# Patient Record
Sex: Female | Born: 1960
Health system: Southern US, Community
[De-identification: ages and names within clinical notes are randomized; demographics above are authoritative.]

## PROBLEM LIST (undated history)

## (undated) DIAGNOSIS — M7120 Synovial cyst of popliteal space [Baker], unspecified knee: Secondary | ICD-10-CM

## (undated) DIAGNOSIS — R000 Tachycardia, unspecified: Secondary | ICD-10-CM

## (undated) DIAGNOSIS — R0602 Shortness of breath: Secondary | ICD-10-CM

## (undated) DIAGNOSIS — E785 Hyperlipidemia, unspecified: Secondary | ICD-10-CM

## (undated) DIAGNOSIS — I471 Supraventricular tachycardia, unspecified: Secondary | ICD-10-CM

## (undated) HISTORY — DX: Hyperlipidemia, unspecified: E78.5

## (undated) HISTORY — PX: COLONOSCOPY: SHX174

## (undated) HISTORY — DX: Supraventricular tachycardia, unspecified: I47.10

## (undated) HISTORY — DX: Synovial cyst of popliteal space (Baker), unspecified knee: M71.20

## (undated) HISTORY — PX: WISDOM TOOTH EXTRACTION: SHX21

## (undated) HISTORY — DX: Shortness of breath: R06.02

## (undated) HISTORY — PX: OTHER SURGICAL HISTORY: SHX169

## (undated) HISTORY — DX: Tachycardia, unspecified: R00.0

## (undated) HISTORY — DX: Supraventricular tachycardia: I47.1

---

## 1998-10-23 ENCOUNTER — Other Ambulatory Visit: Admission: RE | Admit: 1998-10-23 | Discharge: 1998-10-23 | Payer: Self-pay | Admitting: Obstetrics and Gynecology

## 1999-11-17 ENCOUNTER — Other Ambulatory Visit: Admission: RE | Admit: 1999-11-17 | Discharge: 1999-11-17 | Payer: Self-pay | Admitting: Obstetrics and Gynecology

## 2001-02-10 ENCOUNTER — Other Ambulatory Visit: Admission: RE | Admit: 2001-02-10 | Discharge: 2001-02-10 | Payer: Self-pay | Admitting: Obstetrics and Gynecology

## 2012-04-11 ENCOUNTER — Other Ambulatory Visit: Payer: Self-pay | Admitting: Orthopedic Surgery

## 2012-04-11 DIAGNOSIS — M7120 Synovial cyst of popliteal space [Baker], unspecified knee: Secondary | ICD-10-CM

## 2012-04-12 ENCOUNTER — Ambulatory Visit
Admission: RE | Admit: 2012-04-12 | Discharge: 2012-04-12 | Disposition: A | Discharge status: Home / Self Care | Payer: BC Managed Care – PPO | Source: Ambulatory Visit | Attending: Orthopedic Surgery | Admitting: Orthopedic Surgery

## 2012-04-12 DIAGNOSIS — M7120 Synovial cyst of popliteal space [Baker], unspecified knee: Secondary | ICD-10-CM

## 2012-04-12 HISTORY — DX: Synovial cyst of popliteal space (Baker), unspecified knee: M71.20

## 2012-04-18 ENCOUNTER — Other Ambulatory Visit: Payer: Self-pay

## 2012-04-25 ENCOUNTER — Other Ambulatory Visit: Payer: Self-pay

## 2013-06-21 ENCOUNTER — Other Ambulatory Visit: Payer: Self-pay | Admitting: Physician Assistant

## 2013-06-21 ENCOUNTER — Ambulatory Visit
Admission: RE | Admit: 2013-06-21 | Discharge: 2013-06-21 | Disposition: A | Discharge status: Home / Self Care | Payer: BC Managed Care – PPO | Source: Ambulatory Visit | Attending: Physician Assistant | Admitting: Physician Assistant

## 2013-06-21 DIAGNOSIS — R0602 Shortness of breath: Secondary | ICD-10-CM

## 2013-06-26 DIAGNOSIS — R000 Tachycardia, unspecified: Secondary | ICD-10-CM

## 2013-06-26 DIAGNOSIS — R0602 Shortness of breath: Secondary | ICD-10-CM

## 2013-06-26 HISTORY — DX: Tachycardia, unspecified: R00.0

## 2013-06-26 HISTORY — DX: Shortness of breath: R06.02

## 2013-07-06 ENCOUNTER — Encounter: Payer: Self-pay | Admitting: Cardiology

## 2013-07-06 ENCOUNTER — Ambulatory Visit (INDEPENDENT_AMBULATORY_CARE_PROVIDER_SITE_OTHER): Payer: BC Managed Care – PPO | Admitting: Cardiology

## 2013-07-06 VITALS — BP 137/77 | HR 73 | Ht 68.0 in | Wt 158.0 lb

## 2013-07-06 DIAGNOSIS — I471 Supraventricular tachycardia: Secondary | ICD-10-CM

## 2013-07-06 DIAGNOSIS — R079 Chest pain, unspecified: Secondary | ICD-10-CM

## 2013-07-06 NOTE — Progress Notes (Signed)
Patient ID: Amber Patel, female   DOB: 21-Apr-1961, 52 y.o.   MRN: [id number]    Patient Name: Amber Patel Date of Encounter: 07/06/2013  Primary Care Provider:  No primary provider on file. Primary Cardiologist:  Tobias Alexander, H  Patient Profile  Tachycardia  Problem List   No past medical history on file. No past surgical history on file.  Allergies  No Known Allergies  HPI  A very pleasant  52 year old younger appearing female who is coming for concern of tachycardia. The patient has no prior medical history and she has been very active her whole life. In the last year she was trying to be even more active and started to attend multiple exercise classes including spinning and yoga. The patient states that when she goes to her spinning class she is trying to push herself and her heart rate that she is following on her heart rate monitor goes up to 200 beats per minute. She becomes profoundly short of breath and sometimes it is even associated with chest tightness. This doesn't happen after very high level of exercise. She doesn't have any other episodes of chest pain no orthopnea no paroxysmal nocturnal dyspnea no lower extremity edema no prior episode of syncope.  Home Medications  Prior to Admission medications   Medication Sig Start Date End Date Taking? Authorizing Provider  ALPRAZolam Prudy Feeler) 0.25 MG tablet Take 0.25 mg by mouth at bedtime as needed for sleep.   Yes Historical Provider, MD  aspirin 81 MG tablet Take 81 mg by mouth as needed for pain.   Yes Historical Provider, MD    Family History  No family history on file.  Social History  History   Social History  . Marital Status: Unknown    Spouse Name: N/A    Number of Children: N/A  . Years of Education: N/A   Occupational History  . Not on file.   Social History Main Topics  . Smoking status: Never Smoker   . Smokeless tobacco: Not on file  . Alcohol Use: Yes  . Drug Use: No  . Sexual  Activity: Not on file   Other Topics Concern  . Not on file   Social History Narrative  . No narrative on file     Review of Systems as per history of present illness otherwise negative General:  No chills, fever, night sweats or weight changes.  Cardiovascular:  No chest pain, dyspnea on exertion, edema, orthopnea, palpitations, paroxysmal nocturnal dyspnea. Dermatological: No rash, lesions/masses Respiratory: No cough, dyspnea Urologic: No hematuria, dysuria Abdominal:   No nausea, vomiting, diarrhea, bright red blood per rectum, melena, or hematemesis Neurologic:  No visual changes, wkns, changes in mental status. All other systems reviewed and are otherwise negative except as noted above.  Physical Exam  Blood pressure 137/77, pulse 73, height 5\' 8"  (1.727 m), weight 158 lb (71.668 kg), last menstrual period 06/12/2013.  General: Pleasant, NAD Psych: Normal affect. Neuro: Alert and oriented X 3. Moves all extremities spontaneously. HEENT: Normal  Neck: Supple without bruits or JVD. Lungs:  Resp regular and unlabored, CTA. Heart: RRR no s3, s4, or murmurs. Abdomen: Soft, non-tender, non-distended, BS + x 4.  Extremities: No clubbing, cyanosis or edema. DP/PT/Radials 2+ and equal bilaterally.  Accessory Clinical Findings  ECG - sinus rhythm 73 beats per minutes, normal EKG    Assessment & Plan  52 year old young-appearing female with concerns of exercise induced tachycardia. He was scheduled the patient for an exercise stress echocardiogram, assess  her exercise capacity as well as heart rate and blood pressure response to exercise and rule out ischemia. At rest the patient's blood pressure is always controlled. She has had certain her cholesterol panel checked primary care physician and he feels all within normal limit. We will also order a 48-hour Holter monitor to do want this patient to wear during her exercise to see for how long her heart rate remains increased after  her exercise and also to rule out if there any arrhythmias.  Followup in 6 weeks    Tobias Alexander, Rexene Edison, MD 07/06/2013, 11:54 AM

## 2013-07-06 NOTE — Patient Instructions (Signed)
Your physician has recommended that you wear an event monitor for 2 weeks. Event monitors are medical devices that record the heart's electrical activity. Doctors most often [place] these monitors to diagnose arrhythmias. Arrhythmias are problems with the speed or rhythm of the heartbeat. The monitor is a small, portable device. You can wear one while you do your normal daily activities. This is usually used to diagnose what is causing palpitations/syncope (passing out).  Your physician has requested that you have a stress echocardiogram. For further information please visit [email or web address] Please follow instruction sheet as given.  Your physician has requested that you have an echocardiogram. Echocardiography is a painless test that uses sound waves to create images of your heart. It provides your doctor with information about the size and shape of your heart and how well your heart's chambers and valves are working. This procedure takes approximately one hour. There are no restrictions for this procedure.  Your physician recommends that you schedule a follow-up appointment in: after test.

## 2013-07-12 ENCOUNTER — Other Ambulatory Visit: Payer: Self-pay | Admitting: *Deleted

## 2013-07-12 ENCOUNTER — Encounter: Payer: Self-pay | Admitting: *Deleted

## 2013-07-12 ENCOUNTER — Encounter (INDEPENDENT_AMBULATORY_CARE_PROVIDER_SITE_OTHER): Payer: BC Managed Care – PPO

## 2013-07-12 DIAGNOSIS — I471 Supraventricular tachycardia: Secondary | ICD-10-CM

## 2013-07-12 DIAGNOSIS — R079 Chest pain, unspecified: Secondary | ICD-10-CM

## 2013-07-12 NOTE — Progress Notes (Signed)
Patient ID: Amber Patel, female   DOB: 01/26/1961, 52 y.o.   MRN: [id number] E-Cardio Verite 14 day cardiac event monitor applied to patient.

## 2013-07-27 ENCOUNTER — Other Ambulatory Visit (HOSPITAL_COMMUNITY): Payer: BC Managed Care – PPO

## 2013-07-31 ENCOUNTER — Ambulatory Visit: Payer: BC Managed Care – PPO | Admitting: Cardiology

## 2013-08-02 ENCOUNTER — Ambulatory Visit (HOSPITAL_COMMUNITY): Payer: BC Managed Care – PPO | Attending: Cardiovascular Disease

## 2013-08-02 ENCOUNTER — Encounter: Payer: Self-pay | Admitting: Cardiovascular Disease

## 2013-08-02 DIAGNOSIS — R0609 Other forms of dyspnea: Secondary | ICD-10-CM | POA: Insufficient documentation

## 2013-08-02 DIAGNOSIS — I379 Nonrheumatic pulmonary valve disorder, unspecified: Secondary | ICD-10-CM | POA: Insufficient documentation

## 2013-08-02 DIAGNOSIS — I079 Rheumatic tricuspid valve disease, unspecified: Secondary | ICD-10-CM | POA: Insufficient documentation

## 2013-08-02 DIAGNOSIS — R000 Tachycardia, unspecified: Secondary | ICD-10-CM | POA: Insufficient documentation

## 2013-08-02 DIAGNOSIS — R079 Chest pain, unspecified: Secondary | ICD-10-CM | POA: Insufficient documentation

## 2013-08-02 DIAGNOSIS — R0989 Other specified symptoms and signs involving the circulatory and respiratory systems: Secondary | ICD-10-CM | POA: Insufficient documentation

## 2013-08-02 DIAGNOSIS — I471 Supraventricular tachycardia: Secondary | ICD-10-CM

## 2013-08-02 NOTE — Progress Notes (Signed)
Echocardiogram performed.  

## 2013-08-03 ENCOUNTER — Telehealth: Payer: Self-pay

## 2013-08-03 NOTE — Telephone Encounter (Signed)
Patient was called with results of monitor no answer.LMTC.

## 2013-08-06 ENCOUNTER — Telehealth: Payer: Self-pay | Admitting: Cardiology

## 2013-08-06 ENCOUNTER — Telehealth: Payer: Self-pay

## 2013-08-06 NOTE — Telephone Encounter (Signed)
Follow Up  Pt returning call for monitor results

## 2013-08-06 NOTE — Telephone Encounter (Signed)
Returned call to patient Dr.Nelson reviewed monitor and all tracings normal.

## 2013-08-06 NOTE — Telephone Encounter (Signed)
Patient called no answer.Left message on voice mail to call for monitor results.

## 2013-08-13 ENCOUNTER — Ambulatory Visit (HOSPITAL_COMMUNITY): Payer: BC Managed Care – PPO

## 2013-08-13 ENCOUNTER — Ambulatory Visit (HOSPITAL_COMMUNITY): Payer: BC Managed Care – PPO | Attending: Cardiology | Admitting: Cardiology

## 2013-08-13 ENCOUNTER — Encounter: Payer: Self-pay | Admitting: Cardiology

## 2013-08-13 DIAGNOSIS — I472 Ventricular tachycardia, unspecified: Secondary | ICD-10-CM | POA: Insufficient documentation

## 2013-08-13 DIAGNOSIS — I471 Supraventricular tachycardia: Secondary | ICD-10-CM

## 2013-08-13 DIAGNOSIS — R079 Chest pain, unspecified: Secondary | ICD-10-CM | POA: Insufficient documentation

## 2013-08-13 DIAGNOSIS — I4729 Other ventricular tachycardia: Secondary | ICD-10-CM | POA: Insufficient documentation

## 2013-08-13 NOTE — Progress Notes (Signed)
Stress echo performed. 

## 2013-08-20 ENCOUNTER — Encounter: Payer: Self-pay | Admitting: Cardiology

## 2013-08-20 ENCOUNTER — Ambulatory Visit (INDEPENDENT_AMBULATORY_CARE_PROVIDER_SITE_OTHER): Payer: BC Managed Care – PPO | Admitting: Cardiology

## 2013-08-20 VITALS — BP 119/73 | HR 73 | Ht 68.0 in | Wt 163.0 lb

## 2013-08-20 DIAGNOSIS — I471 Supraventricular tachycardia: Secondary | ICD-10-CM | POA: Insufficient documentation

## 2013-08-20 DIAGNOSIS — I498 Other specified cardiac arrhythmias: Secondary | ICD-10-CM

## 2013-08-20 NOTE — Patient Instructions (Signed)
**Note De-Identified Abrey Bradway Obfuscation** Your physician recommends that you return for lab work in: today  Your physician recommends that you see one of our EP MD's for SVT.

## 2013-08-20 NOTE — Progress Notes (Signed)
Patient ID: Amber Patel, female   DOB: 09-Sep-1961, 52 y.o.   MRN: [id number]    Patient Name: Amber Patel Date of Encounter: 08/20/2013  Primary Care Provider:  No primary provider on file. Primary Cardiologist:  Tobias Alexander, H  Patient Profile  Tachycardia  Problem List   No past medical history on file.  Allergies  No Known Allergies  HPI  A very pleasant  52 year old younger appearing female who is coming for concern of tachycardia. The patient has no prior medical history and she has been very active her whole life. In the last year she was trying to be even more active and started to attend multiple exercise classes including spinning and yoga. The patient states that when she goes to her spinning class she is trying to push herself and her heart rate that she is following on her heart rate monitor goes up to 200 beats per minute. She becomes profoundly short of breath and sometimes it is even associated with chest tightness. This doesn't happen after very high level of exercise. She doesn't have any other episodes of chest pain no orthopnea no paroxysmal nocturnal dyspnea no lower extremity edema no prior episode of syncope.  Home Medications  Prior to Admission medications   Medication Sig Start Date End Date Taking? Authorizing Provider  ALPRAZolam Prudy Feeler) 0.25 MG tablet Take 0.25 mg by mouth at bedtime as needed for sleep.   Yes Historical Provider, MD  aspirin 81 MG tablet Take 81 mg by mouth as needed for pain.   Yes Historical Provider, MD    Family History  No family history on file.  Social History  History   Social History  . Marital Status: Unknown    Spouse Name: N/A    Number of Children: N/A  . Years of Education: N/A   Occupational History  . Not on file.   Social History Main Topics  . Smoking status: Never Smoker   . Smokeless tobacco: Not on file  . Alcohol Use: Yes  . Drug Use: No  . Sexual Activity: Not on file   Other Topics  Concern  . Not on file   Social History Narrative  . No narrative on file     Review of Systems as per history of present illness otherwise negative General:  No chills, fever, night sweats or weight changes.  Cardiovascular:  No chest pain, dyspnea on exertion, edema, orthopnea, palpitations, paroxysmal nocturnal dyspnea. Dermatological: No rash, lesions/masses Respiratory: No cough, dyspnea Urologic: No hematuria, dysuria Abdominal:   No nausea, vomiting, diarrhea, bright red blood per rectum, melena, or hematemesis Neurologic:  No visual changes, wkns, changes in mental status. All other systems reviewed and are otherwise negative except as noted above.  Physical Exam  Blood pressure 119/73, pulse 73, height 5\' 8"  (1.727 m), weight 163 lb (73.936 kg).  General: Pleasant, NAD Psych: Normal affect. Neuro: Alert and oriented X 3. Moves all extremities spontaneously. HEENT: Normal  Neck: Supple without bruits or JVD. Lungs:  Resp regular and unlabored, CTA. Heart: RRR no s3, s4, or murmurs. Abdomen: Soft, non-tender, non-distended, BS + x 4.  Extremities: No clubbing, cyanosis or edema. DP/PT/Radials 2+ and equal bilaterally.  Accessory Clinical Findings  ECG - sinus rhythm 73 beats per minutes, normal EKG    Assessment & Plan  The patient underwent a stress echocardiogram with no signs of ischemia, however at the peak exercise a SVT with HR 215/minute was recorded.  The patient is very healthy and  these SVTs that occur with any exercise are significantly limiting her lifestyle. She currently limits her exercise to low level as she is trying to avoid  arrhythmias and associated symptoms (DOE, chest tightness). I personally don't think that beta blockers would be of much use in her case but she would benefit from an ablation.  We will refer her to EP.   Tobias Alexander, Rexene Edison, MD 08/20/2013, 10:57 AM

## 2013-08-20 NOTE — Addendum Note (Signed)
**Note De-Identified Nishat Livingston Obfuscation** Addended by: Demetrios Loll on: 08/20/2013 11:30 AM   Modules accepted: Orders

## 2013-08-21 ENCOUNTER — Ambulatory Visit (INDEPENDENT_AMBULATORY_CARE_PROVIDER_SITE_OTHER): Payer: BC Managed Care – PPO | Admitting: *Deleted

## 2013-08-21 DIAGNOSIS — I471 Supraventricular tachycardia: Secondary | ICD-10-CM

## 2013-08-21 DIAGNOSIS — I498 Other specified cardiac arrhythmias: Secondary | ICD-10-CM

## 2013-08-21 LAB — BASIC METABOLIC PANEL
BUN: 16 mg/dL (ref 6–23)
CO2: 24 mEq/L (ref 19–32)
Calcium: 8.7 mg/dL (ref 8.4–10.5)
Chloride: 101 mEq/L (ref 96–112)
Creatinine, Ser: 0.9 mg/dL (ref 0.4–1.2)
GFR: 66.89 mL/min (ref >60.00)
Glucose, Bld: 104 mg/dL — ABNORMAL HIGH (ref 70–99)
Potassium: 3.5 mEq/L (ref 3.5–5.1)
Sodium: 134 mEq/L — ABNORMAL LOW (ref 135–145)

## 2013-08-21 LAB — CBC WITH DIFFERENTIAL/PLATELET
Basophils Absolute: 0 10*3/uL (ref 0.0–0.1)
Basophils Relative: 0.3 % (ref 0.0–3.0)
Eosinophils Absolute: 0.2 10*3/uL (ref 0.0–0.7)
Eosinophils Relative: 2.5 % (ref 0.0–5.0)
HCT: 35.4 % — ABNORMAL LOW (ref 36.0–46.0)
Hemoglobin: 12.1 g/dL (ref 12.0–15.0)
Lymphocytes Relative: 21.8 % (ref 12.0–46.0)
Lymphs Abs: 1.9 10*3/uL (ref 0.7–4.0)
MCHC: 34.1 g/dL (ref 30.0–36.0)
MCV: 96.7 fl (ref 78.0–100.0)
Monocytes Absolute: 0.4 10*3/uL (ref 0.1–1.0)
Monocytes Relative: 4.9 % (ref 3.0–12.0)
Neutro Abs: 6.1 10*3/uL (ref 1.4–7.7)
Neutrophils Relative %: 70.5 % (ref 43.0–77.0)
Platelets: 318 10*3/uL (ref 150.0–400.0)
RBC: 3.66 Mil/uL — ABNORMAL LOW (ref 3.87–5.11)
RDW: 13.3 % (ref 11.5–14.6)
WBC: 8.7 10*3/uL (ref 4.5–10.5)

## 2013-08-21 LAB — TSH: TSH: 1.78 u[IU]/mL (ref 0.35–5.50)

## 2013-08-29 ENCOUNTER — Encounter: Payer: Self-pay | Admitting: Internal Medicine

## 2013-08-29 ENCOUNTER — Ambulatory Visit (INDEPENDENT_AMBULATORY_CARE_PROVIDER_SITE_OTHER): Payer: BC Managed Care – PPO | Admitting: Internal Medicine

## 2013-08-29 VITALS — BP 127/79 | HR 78 | Ht 68.0 in | Wt 159.8 lb

## 2013-08-29 DIAGNOSIS — I498 Other specified cardiac arrhythmias: Secondary | ICD-10-CM

## 2013-08-29 DIAGNOSIS — I471 Supraventricular tachycardia: Secondary | ICD-10-CM

## 2013-08-29 MED ORDER — METOPROLOL TARTRATE 25 MG PO TABS: ORAL_TABLET | ORAL | Status: DC

## 2013-08-29 NOTE — Patient Instructions (Signed)
Your physician recommends that you schedule a follow-up appointment in: 2 months with Dr Johney Frame  Your physician has recommended you make the following change in your medication:  1) Take Metoprolol 25mg  prior to exercise

## 2013-08-29 NOTE — Progress Notes (Signed)
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  She reports having episodes of tachypalpitations with exercise over the past year.  She reports that she has noticed that when she is doing spin class or exercising that she appears to be working harder than those around her.  She finds that with exercise her heart rate is 150s-160s with moderate activity but with peak exercise may be > 200 bpm.  She finds herself frequently reducing her workload during exercise (75% of max capacity) to keep her heart rate > 170 bpm.  She denies CP but feels winded with exercise.  When she terminates exercise, her heart rate gradually returns to normal.  She denies any abrupt changes in her heart rate.  Today, she denies symptoms of exertional chest pain, shortness of breath at rest, orthopnea, PND, lower extremity edema, dizziness, presyncope, syncope, or neurologic sequela. The patient is tolerating medications without difficulties and is otherwise without complaint today.    Past Medical History  Diagnosis Date  . SOB (shortness of breath) on exertion 06/26/13    Has become more active & when she takes spinning classes, heart rate goes up to 200 and sometimes it is associated with chest tightness. Stress echo 08/13/13 = no signs of ischemia, however at the peak exercise a SVT with HR 215/minute was recorded.   . Tachycardia 06/26/13  . PSVT (paroxysmal supraventricular tachycardia)   . Baker's cyst of knee 04/12/12   Past Surgical History  Procedure Laterality Date  . Cesarean section      Current Outpatient Prescriptions  Medication Sig Dispense Refill  . ALPRAZolam (XANAX) 0.25 MG tablet Take 0.25 mg by mouth at bedtime as needed for sleep.       No current facility-administered medications for this visit.    No Known Allergies  History   Social History  . Marital Status:  Unknown    Spouse Name: N/A    Number of Children: N/A  . Years of Education: N/A   Occupational History  . Not on file.   Social History Main Topics  . Smoking status: Never Smoker   . Smokeless tobacco: Not on file  . Alcohol Use: Yes     Comment: 1 glass of wine per day, occasional mixed drink per day  . Drug Use: No  . Sexual Activity: Not on file   Other Topics Concern  . Not on file   Social History Narrative   Lives in [place] with spouse and two childre (17,19)   Works as a Airline pilot    Family History  Problem Relation Age of Onset  . Diabetes      ROS- All systems are reviewed and negative except as per the HPI above  Physical Exam: Filed Vitals:   08/29/13 0849  BP: 127/79  Pulse: 78  Height: 5\' 8"  (1.727 m)  Weight: 159 lb 12.8 oz (72.485 kg)    GEN- The patient is well appearing, alert and oriented x 3 today.   Head- normocephalic, atraumatic Eyes-  Sclera clear, conjunctiva pink Ears- hearing intact Oropharynx- clear Neck- supple, no JVP Lymph- no cervical lymphadenopathy Lungs- Clear to ausculation bilaterally, normal work of breathing Heart- Regular rate and rhythm, no murmurs, rubs or gallops, PMI not laterally displaced GI- soft, NT, ND, + BS Extremities- no clubbing, cyanosis, or edema MS- no significant deformity or atrophy Skin- no rash  or lesion Psych- euthymic mood, full affect Neuro- strength and sensation are intact  EKG 07/06/13- sinus rhythm 73 bpm, PR 142, Qt 427 GXT from 08/13/13 is reviewed Event monitor is reviewed  Assessment and Plan:  1. Tachycardia The patient has symptoms of exertional tachycardia.  I have reviewed her event monitor which reveals only sinus rhythm.  Her echo is normal.  I have also reviewed her GXT which does reveal at peak exercise a narrow complex tachycardia at 200 bpm which may represent SVT.  I cannot completely exclude sinus tach but suspect either an atrial tach or reentrant  arrhythmia which quickly terminates.  Therapeutic strategies for tachycardia including medicine and EPS/ablation were discussed in detail with the patient today. At this time, she would prefer to continue her current strategy.  Given that her episodes only occur at peak exercise I think that this is reasonable.  She is given lopressor which she can take prior to exercise to see if this is beneficial.  IF she has increased episodes of arrhythmia then she will consider either daily medication or ablation at that time.

## 2013-10-19 ENCOUNTER — Other Ambulatory Visit: Payer: Self-pay

## 2013-10-19 MED ORDER — METOPROLOL TARTRATE 25 MG PO TABS: ORAL_TABLET | ORAL | Status: DC

## 2013-10-25 ENCOUNTER — Ambulatory Visit: Payer: BC Managed Care – PPO | Admitting: Internal Medicine

## 2013-11-07 ENCOUNTER — Ambulatory Visit: Payer: BC Managed Care – PPO | Admitting: Internal Medicine

## 2013-12-28 ENCOUNTER — Ambulatory Visit: Payer: BC Managed Care – PPO | Admitting: Internal Medicine

## 2014-01-02 ENCOUNTER — Encounter: Payer: Self-pay | Admitting: Internal Medicine

## 2014-01-02 ENCOUNTER — Ambulatory Visit (INDEPENDENT_AMBULATORY_CARE_PROVIDER_SITE_OTHER): Payer: BC Managed Care – PPO | Admitting: Internal Medicine

## 2014-01-02 VITALS — BP 126/82 | HR 75 | Ht 68.0 in | Wt 155.0 lb

## 2014-01-02 DIAGNOSIS — I498 Other specified cardiac arrhythmias: Secondary | ICD-10-CM

## 2014-01-02 DIAGNOSIS — I471 Supraventricular tachycardia: Secondary | ICD-10-CM

## 2014-01-02 NOTE — Patient Instructions (Signed)
Your physician wants you to follow-up in: 12 months with Dr Allred You will receive a reminder letter in the mail two months in advance. If you don't receive a letter, please call our office to schedule the follow-up appointment.  

## 2014-01-02 NOTE — Progress Notes (Signed)
PCP:  REDMON,[name], PA-C Cardiologist: Meda Coffee  The patient presents today for routine electrophysiology followup.  Since last being seen in our clinic, the patient reports doing very well. Short acting Metoprolol taken prior to spin classes have mostly relieved her symptoms.    Today, she denies symptoms of palpitations, chest pain, shortness of breath, orthopnea, PND, lower extremity edema, dizziness, presyncope, syncope, or neurologic sequela.  The patient feels that she is tolerating medications without difficulties and is otherwise without complaint today.   Past Medical History  Diagnosis Date  . SOB (shortness of breath) on exertion 06/26/13    Has become more active & when she takes spinning classes, heart rate goes up to 200 and sometimes it is associated with chest tightness. Stress echo 08/13/13 = no signs of ischemia, however at the peak exercise a SVT with HR 215/minute was recorded.   . Tachycardia 06/26/13  . PSVT (paroxysmal supraventricular tachycardia)   . Baker's cyst of knee 04/12/12   Past Surgical History  Procedure Laterality Date  . Cesarean section      Current Outpatient Prescriptions  Medication Sig Dispense Refill  . ALPRAZolam (XANAX) 0.25 MG tablet Take 0.25 mg by mouth at bedtime as needed for sleep.      . metoprolol tartrate (LOPRESSOR) 25 MG tablet Take 1 prior to exercise  30 tablet  1  . NON FORMULARY Lo dose birth control. Take 1 tablet once a day       No current facility-administered medications for this visit.    No Known Allergies  History   Social History  . Marital Status: Unknown    Spouse Name: N/A    Number of Children: N/A  . Years of Education: N/A   Occupational History  . Not on file.   Social History Main Topics  . Smoking status: Never Smoker   . Smokeless tobacco: Not on file  . Alcohol Use: Yes     Comment: 1 glass of wine per day, occasional mixed drink per day  . Drug Use: No  . Sexual Activity: Not on file    Other Topics Concern  . Not on file   Social History Narrative   Lives in [place] with spouse and two childre (26,19)   Works as a Cytogeneticist    Family History  Problem Relation Age of Onset  . Diabetes      ROS-  All systems are reviewed and are negative except as outlined in the HPI above  Physical Exam: Filed Vitals:   01/02/14 0959  BP: 126/82  Pulse: 75  Height: 5\' 8"  (1.727 m)  Weight: 155 lb (70.308 kg)    GEN- The patient is well appearing, alert and oriented x 3 today.   Head- normocephalic, atraumatic Eyes-  Sclera clear, conjunctiva pink Ears- hearing intact Oropharynx- clear Neck- supple, no JVP Lymph- no cervical lymphadenopathy Lungs- Clear to ausculation bilaterally, normal work of breathing Heart- Regular rate and rhythm, no murmurs, rubs or gallops, PMI not laterally displaced GI- soft, NT, ND, + BS Extremities- no clubbing, cyanosis, or edema MS- no significant deformity or atrophy Skin- no rash or lesion Psych- euthymic mood, full affect Neuro- strength and sensation are intact  ekg today reveals sinus rhythm, 75 bpm, otherwise normal ekg  Assessment and Plan:   Primary Care Physician: REDMON,[name], PA-C  Referring Physician: Dr Smiley Houseman is a 53 y.o. female with a h/o tachycardia who presents today for EP consultation. She reports having episodes  of tachypalpitations with exercise over the past year. She reports that she has noticed that when she is doing spin class or exercising that she appears to be working harder than those around her. She finds that with exercise her heart rate is 150s-160s with moderate activity but with peak exercise may be > 200 bpm. She finds herself frequently reducing her workload during exercise (75% of max capacity) to keep her heart rate > 170 bpm. She denies CP but feels winded with exercise. When she terminates exercise, her heart rate gradually returns to normal. She denies any abrupt  changes in her heart rate.  Today, she denies symptoms of exertional chest pain, shortness of breath at rest, orthopnea, PND, lower extremity edema, dizziness, presyncope, syncope, or neurologic sequela. The patient is tolerating medications without difficulties and is otherwise without complaint today.  Past Medical History   Diagnosis  Date   .  SOB (shortness of breath) on exertion  06/26/13     Has become more active & when she takes spinning classes, heart rate goes up to 200 and sometimes it is associated with chest tightness. Stress echo 08/13/13 = no signs of ischemia, however at the peak exercise a SVT with HR 215/minute was recorded.   .  Tachycardia  06/26/13   .  PSVT (paroxysmal supraventricular tachycardia)    .  Baker's cyst of knee  04/12/12    Past Surgical History   Procedure  Laterality  Date   .  Cesarean section      Current Outpatient Prescriptions   Medication  Sig  Dispense  Refill   .  ALPRAZolam (XANAX) 0.25 MG tablet  Take 0.25 mg by mouth at bedtime as needed for sleep.      No current facility-administered medications for this visit.   No Known Allergies  History    Social History   .  Marital Status:  Unknown     Spouse Name:  N/A     Number of Children:  N/A   .  Years of Education:  N/A    Occupational History   .  Not on file.    Social History Main Topics   .  Smoking status:  Never Smoker   .  Smokeless tobacco:  Not on file   .  Alcohol Use:  Yes      Comment: 1 glass of wine per day, occasional mixed drink per day   .  Drug Use:  No   .  Sexual Activity:  Not on file    Other Topics  Concern   .  Not on file    Social History Narrative    Lives in [place] with spouse and two childre (71,19)    Works as a Cytogeneticist    Family History   Problem  Relation  Age of Onset   .  Diabetes     ROS- All systems are reviewed and negative except as per the HPI above  Physical Exam:  Filed Vitals:    08/29/13 0849   BP:  127/79    Pulse:  78   Height:  5\' 8"  (1.727 m)   Weight:  159 lb 12.8 oz (72.485 kg)   GEN- The patient is well appearing, alert and oriented x 3 today.  Head- normocephalic, atraumatic  Eyes- Sclera clear, conjunctiva pink  Ears- hearing intact  Oropharynx- clear  Neck- supple, no JVP  Lymph- no cervical lymphadenopathy  Lungs- Clear to ausculation bilaterally, normal work  of breathing  Heart- Regular rate and rhythm, no murmurs, rubs or gallops, PMI not laterally displaced  GI- soft, NT, ND, + BS  Extremities- no clubbing, cyanosis, or edema  MS- no significant deformity or atrophy  Skin- no rash or lesion  Psych- euthymic mood, full affect  Neuro- strength and sensation are intact  EKG 07/06/13- sinus rhythm 73 bpm, PR 142, Qt 427  GXT from 08/13/13 is reviewed  Event monitor is reviewed  Assessment and Plan:  1. Tachycardia  The patient has symptoms of exertional tachycardia. I have reviewed her event monitor which reveals only sinus rhythm. Her echo is normal. I have also reviewed her GXT which does reveal at peak exercise a narrow complex tachycardia at 200 bpm which may represent SVT. I cannot completely exclude sinus tach but suspect either an atrial tach or reentrant arrhythmia which quickly terminates.  She has done very well with metoprolol prn. No changes at this time  Return in 1 year

## 2014-01-07 ENCOUNTER — Other Ambulatory Visit: Payer: Self-pay

## 2014-01-07 MED ORDER — METOPROLOL TARTRATE 25 MG PO TABS: ORAL_TABLET | ORAL | Status: DC

## 2014-01-09 ENCOUNTER — Encounter: Payer: Self-pay | Admitting: Cardiology

## 2014-01-14 ENCOUNTER — Encounter: Payer: Self-pay | Admitting: Physician Assistant

## 2014-06-26 ENCOUNTER — Other Ambulatory Visit: Payer: Self-pay | Admitting: *Deleted

## 2014-06-26 MED ORDER — METOPROLOL TARTRATE 25 MG PO TABS: ORAL_TABLET | ORAL | Status: DC

## 2015-02-26 ENCOUNTER — Encounter: Payer: Self-pay | Admitting: Cardiology

## 2015-05-14 ENCOUNTER — Encounter: Payer: Self-pay | Admitting: Internal Medicine

## 2015-05-14 ENCOUNTER — Ambulatory Visit (INDEPENDENT_AMBULATORY_CARE_PROVIDER_SITE_OTHER): Payer: BLUE CROSS/BLUE SHIELD | Admitting: Internal Medicine

## 2015-05-14 VITALS — BP 100/70 | HR 71 | Ht 68.0 in | Wt 157.0 lb

## 2015-05-14 DIAGNOSIS — I471 Supraventricular tachycardia: Secondary | ICD-10-CM | POA: Diagnosis not present

## 2015-05-14 NOTE — Patient Instructions (Signed)

## 2015-05-14 NOTE — Progress Notes (Signed)
PCP:  REDMON,[name], PA-C Cardiologist: Meda Coffee  The patient presents today for routine electrophysiology followup.  Since last being seen in our clinic, the patient reports doing very well. She has had no exercise related SVT in over a year.  She is no longer taking metoprolol.  She is pleased with her current health state.  Today, she denies symptoms of palpitations, chest pain, shortness of breath, orthopnea, PND, lower extremity edema, dizziness, presyncope, syncope, or neurologic sequela.  The patient feels that she is tolerating medications without difficulties and is otherwise without complaint today.   Past Medical History  Diagnosis Date  . SOB (shortness of breath) on exertion 06/26/13    Has become more active & when she takes spinning classes, heart rate goes up to 200 and sometimes it is associated with chest tightness. Stress echo 08/13/13 = no signs of ischemia, however at the peak exercise a SVT with HR 215/minute was recorded.   . Tachycardia 06/26/13  . PSVT (paroxysmal supraventricular tachycardia)   . Baker's cyst of knee 04/12/12   Past Surgical History  Procedure Laterality Date  . Cesarean section      Current Outpatient Prescriptions  Medication Sig Dispense Refill  . ALPRAZolam (XANAX) 0.25 MG tablet Take 0.25 mg by mouth at bedtime as needed for sleep.    . metoprolol tartrate (LOPRESSOR) 25 MG tablet Take 25 mg by mouth as directed. Take daily as needed for 1 hour prior to exercise    . NON FORMULARY Lo dose birth control. Take 1 tablet once a day     No current facility-administered medications for this visit.    No Known Allergies  Social History   Social History  . Marital Status: Unknown    Spouse Name: N/A  . Number of Children: N/A  . Years of Education: N/A   Occupational History  . Not on file.   Social History Main Topics  . Smoking status: Never Smoker   . Smokeless tobacco: Not on file  . Alcohol Use: Yes     Comment: 1 glass of  wine per day, occasional mixed drink per day  . Drug Use: No  . Sexual Activity: Not on file   Other Topics Concern  . Not on file   Social History Narrative   Lives in [place] with spouse and two childre (74,19)   Works as a Cytogeneticist    Family History  Problem Relation Age of Onset  . Diabetes Maternal Grandfather   . Prostate cancer Father     ROS-  All systems are reviewed and are negative except as outlined in the HPI above  Physical Exam: Filed Vitals:   05/14/15 1104  BP: 100/70  Pulse: 71  Height: 5\' 8"  (1.727 m)  Weight: 71.215 kg (157 lb)    GEN- The patient is well appearing, alert and oriented x 3 today.   Head- normocephalic, atraumatic Eyes-  Sclera clear, conjunctiva pink Ears- hearing intact Oropharynx- clear Neck- supple, no JVP Lymph- no cervical lymphadenopathy Lungs- Clear to ausculation bilaterally, normal work of breathing Heart- Regular rate and rhythm, no murmurs, rubs or gallops, PMI not laterally displaced GI- soft, NT, ND, + BS Extremities- no clubbing, cyanosis, or edema MS- no significant deformity or atrophy Skin- no rash or lesion Psych- euthymic mood, full affect Neuro- strength and sensation are intact  ekg today reveals sinus rhythm, 71 bpm, otherwise normal ekg  Assessment and Plan:  1. Tachycardia  The patient has symptoms of exertional  tachycardia. I have reviewed her event monitor previously which reveals only sinus rhythm. Her echo is normal. I have also reviewed her GXT which does reveal at peak exercise a narrow complex tachycardia at 200 bpm which may represent SVT. I cannot completely exclude sinus tach but suspect either an atrial tach or reentrant arrhythmia which quickly terminates.  She has done very well with metoprolol prn. No changes at this time  Return in 1 year

## 2016-02-05 ENCOUNTER — Other Ambulatory Visit: Payer: Self-pay | Admitting: Obstetrics and Gynecology

## 2016-02-13 NOTE — H&P (Signed)
55 y.o. yo complains of bleeding perimenopausal and found to have abnormal cyst on LO.    In Office: "Pt. states she has been bleeding 3 weeks, started on 10/07/15 and stopped yesterday, used 2 tampons daily and also having fatigue, had CBC abd TSH done with PCP 1 week ago. Pelvic T/V U/S; 7x4x4; L cyst 2.31 simple cyst without increase in blood flow with 0.5 cm posterior nodule. EM is 0.22 cm. Doing continuous OCPs. Having some fatigue recent"  [place] repeated was LO cyst- EM 0.28 cm and LO cyst 2.6 cm with nodule inside, no increase BF and no FF.  Pt stopped OCPs secondary weight gain and bleeding did not return.  CA 125 was 7.  Will proceed with removal of LO and bilateral salpingectomies.   Past Medical History  Diagnosis Date  . SOB (shortness of breath) on exertion 06/26/13    Has become more active & when she takes spinning classes, heart rate goes up to 200 and sometimes it is associated with chest tightness. Stress echo 08/13/13 = no signs of ischemia, however at the peak exercise a SVT with HR 215/minute was recorded.   . Tachycardia 06/26/13  . PSVT (paroxysmal supraventricular tachycardia)   . Baker's cyst of knee 04/12/12   Past Surgical History  Procedure Laterality Date  . Cesarean section      Social History   Social History  . Marital Status: Unknown    Spouse Name: N/A  . Number of Children: N/A  . Years of Education: N/A   Occupational History  . Not on file.   Social History Main Topics  . Smoking status: Never Smoker   . Smokeless tobacco: Not on file  . Alcohol Use: Yes     Comment: 1 glass of wine per day, occasional mixed drink per day  . Drug Use: No  . Sexual Activity: Not on file   Other Topics Concern  . Not on file   Social History Narrative   Lives in [place] with spouse and two childre (4,19)   Works as a Cytogeneticist    No current facility-administered medications on file prior to encounter.   Current Outpatient Prescriptions on  File Prior to Encounter  Medication Sig Dispense Refill  . ALPRAZolam (XANAX) 0.25 MG tablet Take 0.25 mg by mouth at bedtime as needed for sleep.    . metoprolol tartrate (LOPRESSOR) 25 MG tablet Take 25 mg by mouth as directed. Take daily as needed for 1 hour prior to exercise    . NON FORMULARY Lo dose birth control. Take 1 tablet once a day      No Known Allergies  @VITALS2 @  Lungs: clear to ascultation Cor:  RRR Abdomen:  soft, nontender, nondistended. Ex:  no cords, erythema Pelvic:   Vulva: no masses, no atrophy, no lesions  Vagina: no tenderness, no erythema, no abnormal vaginal discharge, no vesicle(s) or ulcers, no cystocele, no rectocele  Cervix: grossly normal, no discharge, no cervical motion tenderness  Uterus: normal size (7), normal shape, midline, no uterine prolapse, non-tender  Bladder/Urethra: normal meatus, no urethral discharge, no urethral mass, bladder non distended, Urethra well supported  Adnexa/Parametria: no parametrial tenderness, no parametrial mass, no adnexal tenderness, no ovarian mass    A:  For robotic LSO, R salpingectomy for LO with nodular cyst and no other signs of cancer.   P: All risks, benefits and alternatives d/w patient and she desires to proceed.  Patient has undergone a modified bowel prep and will  receive preop antibiotics and SCDs during the operation.     Shanika Levings A

## 2016-03-02 ENCOUNTER — Ambulatory Visit: Admit: 2016-03-02 | Payer: BLUE CROSS/BLUE SHIELD | Admitting: Obstetrics and Gynecology

## 2016-03-02 SURGERY — ROBOTIC ASSISTED BILATERAL SALPINGO OOPHORECTOMY
Anesthesia: Choice

## 2016-03-02 NOTE — Patient Instructions (Addendum)
Your procedure is scheduled on:  Friday, March 12, 2016  Enter through the Main Entrance of [facility] at:  6:00 AM  Pick up the phone at the desk and dial [phone].  Call this number if you have problems the morning of surgery: [phone].  Remember: Do NOT eat food or drink after:  Midnight Thursday, March 11, 2016  Take these medicines the morning of surgery with a SIP OF WATER:  None  Do NOT wear jewelry (body piercing), metal hair clips/bobby pins, make-up, or nail polish. Do NOT wear lotions, powders, or perfumes.  You may wear deodorant. Do NOT shave for 48 hours prior to surgery. Do NOT bring valuables to the hospital. Contacts, dentures, or bridgework may not be worn into surgery.  Have a responsible adult drive you home and stay with you for 24 hours after your procedure  Bring a copy of living will day of surgery

## 2016-03-03 ENCOUNTER — Encounter (HOSPITAL_COMMUNITY)
Admission: RE | Admit: 2016-03-03 | Discharge: 2016-03-03 | Disposition: A | Discharge status: Home / Self Care | Payer: BLUE CROSS/BLUE SHIELD | Source: Ambulatory Visit | Attending: Obstetrics and Gynecology | Admitting: Obstetrics and Gynecology

## 2016-03-03 ENCOUNTER — Encounter (HOSPITAL_COMMUNITY): Payer: Self-pay

## 2016-03-03 DIAGNOSIS — Z01812 Encounter for preprocedural laboratory examination: Secondary | ICD-10-CM | POA: Diagnosis not present

## 2016-03-03 LAB — CBC
HEMATOCRIT: 37.8 % (ref 36.0–46.0)
HEMOGLOBIN: 12.9 g/dL (ref 12.0–15.0)
MCH: 32.6 pg (ref 26.0–34.0)
MCHC: 34.1 g/dL (ref 30.0–36.0)
MCV: 95.5 fL (ref 78.0–100.0)
Platelets: 272 10*3/uL (ref 150–400)
RBC: 3.96 MIL/uL (ref 3.87–5.11)
RDW: 12.9 % (ref 11.5–15.5)
WBC: 5.5 10*3/uL (ref 4.0–10.5)

## 2016-03-03 LAB — ABO/RH: ABO/RH(D): O POS

## 2016-03-03 LAB — TYPE AND SCREEN
ABO/RH(D): O POS
Antibody Screen: NEGATIVE

## 2016-03-11 ENCOUNTER — Encounter (HOSPITAL_COMMUNITY): Payer: Self-pay | Admitting: Anesthesiology

## 2016-03-12 ENCOUNTER — Encounter (HOSPITAL_COMMUNITY)
Admission: RE | Disposition: A | Discharge status: Home / Self Care | Payer: Self-pay | Source: Ambulatory Visit | Attending: Obstetrics and Gynecology

## 2016-03-12 ENCOUNTER — Ambulatory Visit (HOSPITAL_COMMUNITY)
Admission: RE | Admit: 2016-03-12 | Discharge: 2016-03-12 | Disposition: A | Discharge status: Home / Self Care | Payer: BLUE CROSS/BLUE SHIELD | Source: Ambulatory Visit | Attending: Obstetrics and Gynecology | Admitting: Obstetrics and Gynecology

## 2016-03-12 ENCOUNTER — Ambulatory Visit (HOSPITAL_COMMUNITY): Payer: BLUE CROSS/BLUE SHIELD | Admitting: Anesthesiology

## 2016-03-12 ENCOUNTER — Encounter (HOSPITAL_COMMUNITY): Payer: Self-pay | Admitting: *Deleted

## 2016-03-12 DIAGNOSIS — Z9889 Other specified postprocedural states: Secondary | ICD-10-CM

## 2016-03-12 DIAGNOSIS — D271 Benign neoplasm of left ovary: Secondary | ICD-10-CM | POA: Insufficient documentation

## 2016-03-12 DIAGNOSIS — N83292 Other ovarian cyst, left side: Secondary | ICD-10-CM | POA: Diagnosis present

## 2016-03-12 DIAGNOSIS — I471 Supraventricular tachycardia: Secondary | ICD-10-CM | POA: Insufficient documentation

## 2016-03-12 DIAGNOSIS — N838 Other noninflammatory disorders of ovary, fallopian tube and broad ligament: Secondary | ICD-10-CM | POA: Diagnosis not present

## 2016-03-12 HISTORY — PX: ROBOTIC ASSISTED BILATERAL SALPINGO OOPHERECTOMY: SHX6078

## 2016-03-12 SURGERY — ROBOTIC ASSISTED BILATERAL SALPINGO OOPHORECTOMY
Anesthesia: General | Site: Abdomen | Laterality: Bilateral

## 2016-03-12 MED ORDER — LIDOCAINE HCL (CARDIAC) 20 MG/ML IV SOLN
INTRAVENOUS | Status: DC | PRN
  Administered 2016-03-12: 100 mg via INTRAVENOUS

## 2016-03-12 MED ORDER — HEPARIN SODIUM (PORCINE) 5000 UNIT/ML IJ SOLN
INTRAMUSCULAR | Status: AC
  Filled 2016-03-12: qty 1

## 2016-03-12 MED ORDER — ACETAMINOPHEN 10 MG/ML IV SOLN
1000.0000 mg | Freq: Once | INTRAVENOUS | Status: AC
  Administered 2016-03-12: 1000 mg via INTRAVENOUS
  Filled 2016-03-12: qty 100

## 2016-03-12 MED ORDER — MIDAZOLAM HCL 2 MG/2ML IJ SOLN
INTRAMUSCULAR | Status: DC | PRN
  Administered 2016-03-12: 2 mg via INTRAVENOUS

## 2016-03-12 MED ORDER — SCOPOLAMINE 1 MG/3DAYS TD PT72
MEDICATED_PATCH | TRANSDERMAL | Status: AC
  Filled 2016-03-12: qty 1

## 2016-03-12 MED ORDER — ROPIVACAINE HCL 5 MG/ML IJ SOLN
INTRAMUSCULAR | Status: AC
  Filled 2016-03-12: qty 30

## 2016-03-12 MED ORDER — ONDANSETRON HCL 4 MG/2ML IJ SOLN
INTRAMUSCULAR | Status: DC | PRN
  Administered 2016-03-12: 4 mg via INTRAVENOUS

## 2016-03-12 MED ORDER — MIDAZOLAM HCL 2 MG/2ML IJ SOLN
INTRAMUSCULAR | Status: AC
  Filled 2016-03-12: qty 2

## 2016-03-12 MED ORDER — NEOSTIGMINE METHYLSULFATE 10 MG/10ML IV SOLN
INTRAVENOUS | Status: DC | PRN
  Administered 2016-03-12: 2 mg via INTRAVENOUS

## 2016-03-12 MED ORDER — HYDROMORPHONE HCL 1 MG/ML IJ SOLN
INTRAMUSCULAR | Status: DC | PRN
  Administered 2016-03-12: 1 mg via INTRAVENOUS

## 2016-03-12 MED ORDER — HYDROMORPHONE HCL 1 MG/ML IJ SOLN
INTRAMUSCULAR | Status: AC
  Filled 2016-03-12: qty 1

## 2016-03-12 MED ORDER — KETOROLAC TROMETHAMINE 30 MG/ML IJ SOLN
30.0000 mg | Freq: Once | INTRAMUSCULAR | Status: AC
  Administered 2016-03-12: 30 mg via INTRAVENOUS

## 2016-03-12 MED ORDER — ONDANSETRON HCL 4 MG/2ML IJ SOLN
INTRAMUSCULAR | Status: AC
  Filled 2016-03-12: qty 2

## 2016-03-12 MED ORDER — FENTANYL CITRATE (PF) 250 MCG/5ML IJ SOLN
INTRAMUSCULAR | Status: AC
  Filled 2016-03-12: qty 5

## 2016-03-12 MED ORDER — KETOROLAC TROMETHAMINE 30 MG/ML IJ SOLN
INTRAMUSCULAR | Status: AC
  Filled 2016-03-12: qty 1

## 2016-03-12 MED ORDER — DEXAMETHASONE SODIUM PHOSPHATE 4 MG/ML IJ SOLN
INTRAMUSCULAR | Status: DC | PRN
  Administered 2016-03-12: 4 mg via INTRAVENOUS

## 2016-03-12 MED ORDER — OXYCODONE-ACETAMINOPHEN 5-325 MG PO TABS: 1.0000 | ORAL_TABLET | ORAL | Status: DC | PRN

## 2016-03-12 MED ORDER — FENTANYL CITRATE (PF) 100 MCG/2ML IJ SOLN: INTRAMUSCULAR | Status: DC | PRN

## 2016-03-12 MED ORDER — FENTANYL CITRATE (PF) 250 MCG/5ML IJ SOLN
INTRAMUSCULAR | Status: DC | PRN
  Administered 2016-03-12 (×4): 50 ug via INTRAVENOUS

## 2016-03-12 MED ORDER — GLYCOPYRROLATE 0.2 MG/ML IJ SOLN
INTRAMUSCULAR | Status: DC | PRN
  Administered 2016-03-12: 0.4 mg via INTRAVENOUS
  Administered 2016-03-12: 0.1 mg via INTRAVENOUS

## 2016-03-12 MED ORDER — FENTANYL CITRATE (PF) 100 MCG/2ML IJ SOLN
INTRAMUSCULAR | Status: AC
  Filled 2016-03-12: qty 2

## 2016-03-12 MED ORDER — LACTATED RINGERS IV SOLN
INTRAVENOUS | Status: DC
  Administered 2016-03-12 (×2): via INTRAVENOUS

## 2016-03-12 MED ORDER — FENTANYL CITRATE (PF) 100 MCG/2ML IJ SOLN
25.0000 ug | INTRAMUSCULAR | Status: DC | PRN
  Administered 2016-03-12: 25 ug via INTRAVENOUS

## 2016-03-12 MED ORDER — SCOPOLAMINE 1 MG/3DAYS TD PT72
1.0000 | MEDICATED_PATCH | Freq: Once | TRANSDERMAL | Status: DC
  Administered 2016-03-12: 1.5 mg via TRANSDERMAL

## 2016-03-12 MED ORDER — SODIUM CHLORIDE 0.9 % IJ SOLN
INTRAMUSCULAR | Status: AC
  Filled 2016-03-12: qty 50

## 2016-03-12 MED ORDER — SODIUM CHLORIDE 0.9 % IV SOLN
INTRAVENOUS | Status: DC | PRN
  Administered 2016-03-12: 120 mL

## 2016-03-12 MED ORDER — PROPOFOL 10 MG/ML IV BOLUS
INTRAVENOUS | Status: DC | PRN
  Administered 2016-03-12: 150 mg via INTRAVENOUS

## 2016-03-12 MED ORDER — LIDOCAINE HCL (CARDIAC) 20 MG/ML IV SOLN
INTRAVENOUS | Status: AC
  Filled 2016-03-12: qty 5

## 2016-03-12 MED ORDER — PROPOFOL 10 MG/ML IV BOLUS
INTRAVENOUS | Status: AC
  Filled 2016-03-12: qty 20

## 2016-03-12 MED ORDER — LACTATED RINGERS IR SOLN
Status: DC | PRN
  Administered 2016-03-12: 3000 mL

## 2016-03-12 MED ORDER — NEOSTIGMINE METHYLSULFATE 10 MG/10ML IV SOLN
INTRAVENOUS | Status: AC
  Filled 2016-03-12: qty 1

## 2016-03-12 MED ORDER — DEXAMETHASONE SODIUM PHOSPHATE 10 MG/ML IJ SOLN
INTRAMUSCULAR | Status: AC
  Filled 2016-03-12: qty 1

## 2016-03-12 MED ORDER — ROCURONIUM BROMIDE 100 MG/10ML IV SOLN
INTRAVENOUS | Status: DC | PRN
  Administered 2016-03-12: 40 mg via INTRAVENOUS

## 2016-03-12 SURGICAL SUPPLY — 47 items
BARRIER ADHS 3X4 INTERCEED (GAUZE/BANDAGES/DRESSINGS) IMPLANT
CLOTH BEACON ORANGE TIMEOUT ST (SAFETY) ×3 IMPLANT
CONT PATH 16OZ SNAP LID 3702 (MISCELLANEOUS) IMPLANT
COVER BACK TABLE 60X90IN (DRAPES) ×6 IMPLANT
COVER TIP SHEARS 8 DVNC (MISCELLANEOUS) ×1 IMPLANT
COVER TIP SHEARS 8MM DA VINCI (MISCELLANEOUS) ×2
DECANTER SPIKE VIAL GLASS SM (MISCELLANEOUS) ×3 IMPLANT
DEFOGGER SCOPE WARMER CLEARIFY (MISCELLANEOUS) ×3 IMPLANT
DRSG COVADERM PLUS 2X2 (GAUZE/BANDAGES/DRESSINGS) ×3 IMPLANT
DRSG OPSITE POSTOP 3X4 (GAUZE/BANDAGES/DRESSINGS) ×3 IMPLANT
DURAPREP 26ML APPLICATOR (WOUND CARE) ×3 IMPLANT
ELECT REM PT RETURN 9FT ADLT (ELECTROSURGICAL) ×3
ELECTRODE REM PT RTRN 9FT ADLT (ELECTROSURGICAL) ×1 IMPLANT
GAUZE VASELINE 3X9 (GAUZE/BANDAGES/DRESSINGS) IMPLANT
GLOVE BIO SURGEON STRL SZ7 (GLOVE) ×6 IMPLANT
GLOVE BIOGEL PI IND STRL 7.0 (GLOVE) ×1 IMPLANT
GLOVE BIOGEL PI INDICATOR 7.0 (GLOVE) ×2
GLOVE ECLIPSE 6.5 STRL STRAW (GLOVE) IMPLANT
KIT ACCESSORY DA VINCI DISP (KITS) ×2
KIT ACCESSORY DVNC DISP (KITS) ×1 IMPLANT
LEGGING LITHOTOMY PAIR STRL (DRAPES) ×3 IMPLANT
LIQUID BAND (GAUZE/BANDAGES/DRESSINGS) ×3 IMPLANT
MANIPULATOR UTERINE 4.5 ZUMI (MISCELLANEOUS) IMPLANT
NEEDLE INSUFFLATION 120MM (ENDOMECHANICALS) ×3 IMPLANT
NS IRRIG 1000ML POUR BTL (IV SOLUTION) ×9 IMPLANT
PACK ROBOT WH (CUSTOM PROCEDURE TRAY) ×3 IMPLANT
PACK ROBOTIC GOWN (GOWN DISPOSABLE) ×3 IMPLANT
PAD PREP 24X48 CUFFED NSTRL (MISCELLANEOUS) ×6 IMPLANT
PAD TRENDELENBURG POSITION (MISCELLANEOUS) ×3 IMPLANT
POUCH SPECIMEN RETRIEVAL 10MM (ENDOMECHANICALS) ×3 IMPLANT
SET CYSTO W/LG BORE CLAMP LF (SET/KITS/TRAYS/PACK) IMPLANT
SET IRRIG TUBING LAPAROSCOPIC (IRRIGATION / IRRIGATOR) ×3 IMPLANT
SET TRI-LUMEN FLTR TB AIRSEAL (TUBING) IMPLANT
SUT VIC AB 2-0 CT2 27 (SUTURE) IMPLANT
SUT VICRYL 0 UR6 27IN ABS (SUTURE) ×3 IMPLANT
SUT VICRYL RAPIDE 3 0 (SUTURE) ×6 IMPLANT
SYR 50ML LL SCALE MARK (SYRINGE) IMPLANT
TIP UTERINE 5.1X6CM LAV DISP (MISCELLANEOUS) IMPLANT
TIP UTERINE 6.7X10CM GRN DISP (MISCELLANEOUS) IMPLANT
TIP UTERINE 6.7X6CM WHT DISP (MISCELLANEOUS) IMPLANT
TIP UTERINE 6.7X8CM BLUE DISP (MISCELLANEOUS) IMPLANT
TOWEL OR 17X24 6PK STRL BLUE (TOWEL DISPOSABLE) ×9 IMPLANT
TRAY FOLEY CATH SILVER 14FR (SET/KITS/TRAYS/PACK) ×3 IMPLANT
TROCAR DILATING TIP 12MM 150MM (ENDOMECHANICALS) ×3 IMPLANT
TROCAR DISP BLADELESS 8 DVNC (TROCAR) ×1 IMPLANT
TROCAR DISP BLADELESS 8MM (TROCAR) ×2
TROCAR PORT AIRSEAL 5X120 (TROCAR) IMPLANT

## 2016-03-12 NOTE — Progress Notes (Signed)
There has been no change in the patients history, status or exam since the history and physical.  Filed Vitals:   03/12/16 0614  BP: 124/85  Pulse: 80  Temp: 98.1 F (36.7 C)  TempSrc: Oral  Resp: 18  SpO2: 99%    Lab Results  Component Value Date   WBC 5.5 03/03/2016   HGB 12.9 03/03/2016   HCT 37.8 03/03/2016   MCV 95.5 03/03/2016   PLT 272 03/03/2016    Mindee Robledo A

## 2016-03-12 NOTE — Anesthesia Preprocedure Evaluation (Signed)
Anesthesia Evaluation  Patient identified by MRN, date of birth, ID band Patient awake    Reviewed: Allergy & Precautions, H&P , Patient's Chart, lab work & pertinent test results  Airway Mallampati: II  TM Distance: >3 FB Neck ROM: full    Dental no notable dental hx.    Pulmonary    Pulmonary exam normal breath sounds clear to auscultation       Cardiovascular Exercise Tolerance: Good  Rhythm:regular Rate:Normal     Neuro/Psych    GI/Hepatic   Endo/Other    Renal/GU      Musculoskeletal   Abdominal   Peds  Hematology   Anesthesia Other Findings   Reproductive/Obstetrics                             Anesthesia Physical Anesthesia Plan  ASA: II  Anesthesia Plan: General   Post-op Pain Management:    Induction: Intravenous  Airway Management Planned: Oral ETT  Additional Equipment:   Intra-op Plan:   Post-operative Plan: Extubation in OR  Informed Consent: I have reviewed the patients History and Physical, chart, labs and discussed the procedure including the risks, benefits and alternatives for the proposed anesthesia with the patient or authorized representative who has indicated his/her understanding and acceptance.   Dental Advisory Given and Dental advisory given  Plan Discussed with: CRNA and Surgeon  Anesthesia Plan Comments: (  Discussed general anesthesia, including possible nausea, instrumentation of airway, sore throat,pulmonary aspiration, etc. I asked if the were any outstanding questions, or  concerns before we proceeded. )        Anesthesia Quick Evaluation

## 2016-03-12 NOTE — Progress Notes (Signed)
Sacral dressing placed, potential for Robotic case to go longer than scheduled.

## 2016-03-12 NOTE — Op Note (Signed)
03/12/2016  8:38 AM  PATIENT:  Amber Patel  55 y.o. female  PRE-OPERATIVE DIAGNOSIS:  Complex OVARIAN CYST  POST-OPERATIVE DIAGNOSIS:  Complex OVARIAN CYST  PROCEDURE:  Procedure(s): ROBOTIC ASSISTED BILATERAL SALPINGECTOMY  AND LEFT OOPHERECTOMY (Bilateral)  SURGEON:  Surgeon(s) and Role:    * Bobbye Charleston, MD - Primary   ASSISTANTS: Alden Hipp, MD   ANESTHESIA:   general  EBL:  Total I/O In: 1000 [I.V.:1000] Out: 14 [Urine:50; Blood:5]  LOCAL MEDICATIONS USED:  BUPIVICAINE   SPECIMEN:  Source of Specimen:  L ovary and tube, R tube  DISPOSITION OF SPECIMEN:  PATHOLOGY  COUNTS:  YES  TOURNIQUET:  * No tourniquets in log *  DICTATION: .Note written in EPIC  PLAN OF CARE: Discharge to home after PACU  PATIENT DISPOSITION:  PACU - hemodynamically stable.   Delay start of Pharmacological VTE agent (>24hrs) due to surgical blood loss or risk of bleeding: not applicable  Complications:  None.  Findings:  3 cm L Ovary with a 4 cm cyst off the end of it.  It was not adhesed to anything but the descending bowel and sigmoid had adhesions to the anterior abdominal wall.  Appendix and cul de sac were otherwise normal.  The ureters were seen well out of all fields of dissection.  The R ovary was normal.  Technique:  After adequate general anesthesia was achieved, the patient was prepped and draped in sterile fashion.  The speculum was placed into the vagina and the uterine manipulator placed in the cervix. The speculum was removed and a catheter placed to drain the bladder during the surgery.  Attention was turned to the abdomen and a  2 cm incision was made above the umbilicus.  The veress needle passed into the abdomen without aspiration of bowel contents or blood.  The 12 mm trocar for the camera port was introduced after insuflatation and the above findings noted.  Two 8.5 mm trocars were introduced 10 cm either side of the camera port under direct visualization.  The  assist Airseal port was introduced 3 cm above the plane of the other ports. The PK forceps were introduced on arm 2 and the Hot Shears on arm 1.  Peritoneal washings were done at this point.    The adhesions of the sigmoid and colon were removed from the anterior abdominal wall with cautery and the L IP ligament identified.  The IP was isolated by incising the peritoneum above and parallel to it with the hot shears.  The IP was then cauterized in two places and incised with the scissors.  The ureter was seen well below th field of dissection.  The L ovary was then removed with careful cautery on the broad ligament careful to be well away from the structures underneath.  The uterine ovarian ligament was cauterized in 3 places and then incised with the scissors. The ovary was then placed in the anterior cul de sac.  Hemostasis was assured.  The Robot was then undocked.  The 8.5 mm scope was introduced and the ovary and tube were placed inside an endobag.  The bag was then brought through the umbilical incision and opened to the outside.  The smaller portion of the ovary and tube was removed with an allis clamp and the rest of the ovary decompressed with a scalpel.  Fluid, ovary and tube were all successfully removed from the abdomen without any intraperitoneal spill and sent to pathology.  Hemostasis was excellent.  All instruments were removed and the abdomen desuflated.  The 12 mm trocar site fascia was closed with a figure of eight stitch of 2-Vicryl.  All skin incisions were closed with subcuticular stitches and Dermabond.  All instruments were withdrawn from the vagina.  Pt tolerated the procedure well and was returned to the recovery room in stable condition.    Amber Patel A

## 2016-03-12 NOTE — Discharge Instructions (Addendum)
Take ibuprofen 600-800 every 8 hours for 1 week to reduce adhesions.  After that you can resume mobic.  DISCHARGE INSTRUCTIONS: Laparoscopy  The following instructions have been prepared to help you care for yourself upon your return home today.  Wound care:  Do not get the incision wet for the first 24 hours. The incision should be kept clean and dry.  The Band-Aids or dressings may be removed the day after surgery.  Should the incision become sore, red, and swollen after the first week, check with your doctor.  Personal hygiene:  Shower the day after your procedure.  Activity and limitations:  Do NOT drive or operate any equipment today.  Do NOT lift anything more than 15 pounds for 2-3 weeks after surgery.  Do NOT rest in bed all day.  Walking is encouraged. Walk each day, starting slowly with 5-minute walks 3 or 4 times a day. Slowly increase the length of your walks.  Walk up and down stairs slowly.  Do NOT do strenuous activities, such as golfing, playing tennis, bowling, running, biking, weight lifting, gardening, mowing, or vacuuming for 2-4 weeks. Ask your doctor when it is okay to start.  Diet: Eat a light meal as desired this evening. You may resume your usual diet tomorrow.  Return to work: This is dependent on the type of work you do. For the most part you can return to a desk job within a week of surgery. If you are more active at work, please discuss this with your doctor.  What to expect after your surgery: You may have a slight burning sensation when you urinate on the first day. You may have a very small amount of blood in the urine. Expect to have a small amount of vaginal discharge/light bleeding for 1-2 weeks. It is not unusual to have abdominal soreness and bruising for up to 2 weeks. You may be tired and need more rest for about 1 week. You may experience shoulder pain for 24-72 hours. Lying flat in bed may relieve it.   You may remove the patch behind  your right ear on or before 03/15/16.  Wash your hands with soap and water after contact with the patch.  Call your doctor for any of the following:  Develop a fever of 100.4 or greater  Inability to urinate 6 hours after discharge from hospital  Severe pain not relieved by pain medications  Persistent of heavy bleeding at incision site  Redness or swelling around incision site after a week  Increasing nausea or vomiting  Patient Signature________________________________________ Nurse Signature_________________________________________

## 2016-03-12 NOTE — Anesthesia Procedure Notes (Signed)
Procedure Name: Intubation Date/Time: 03/12/2016 7:38 AM Performed by: Flossie Dibble Pre-anesthesia Checklist: Patient identified, Timeout performed, Emergency Drugs available, Patient being monitored and Suction available Patient Re-evaluated:Patient Re-evaluated prior to inductionOxygen Delivery Method: Circle system utilized Preoxygenation: Pre-oxygenation with 100% oxygen Intubation Type: IV induction Ventilation: Mask ventilation without difficulty Laryngoscope Size: Mac and 3 Grade View: Grade I Tube type: Oral Tube size: 7.0 mm Number of attempts: 1 Airway Equipment and Method: Stylet Placement Confirmation: ETT inserted through vocal cords under direct vision,  positive ETCO2 and breath sounds checked- equal and bilateral Secured at: 21.5 cm Tube secured with: Tape Dental Injury: Teeth and Oropharynx as per pre-operative assessment

## 2016-03-12 NOTE — Brief Op Note (Signed)
03/12/2016  8:38 AM  PATIENT:  Amber Patel  55 y.o. female  PRE-OPERATIVE DIAGNOSIS:  Complex OVARIAN CYST  POST-OPERATIVE DIAGNOSIS:  Complex OVARIAN CYST  PROCEDURE:  Procedure(s): ROBOTIC ASSISTED BILATERAL SALPINGECTOMY  AND LEFT OOPHERECTOMY (Bilateral)  SURGEON:  Surgeon(s) and Role:    * Bobbye Charleston, MD - Primary   ASSISTANTS: Alden Hipp, MD   ANESTHESIA:   general  EBL:  Total I/O In: 1000 [I.V.:1000] Out: 76 [Urine:50; Blood:5]  LOCAL MEDICATIONS USED:  BUPIVICAINE   SPECIMEN:  Source of Specimen:  L ovary and tube, R tube  DISPOSITION OF SPECIMEN:  PATHOLOGY  COUNTS:  YES  TOURNIQUET:  * No tourniquets in log *  DICTATION: .Note written in EPIC  PLAN OF CARE: Discharge to home after PACU  PATIENT DISPOSITION:  PACU - hemodynamically stable.   Delay start of Pharmacological VTE agent (>24hrs) due to surgical blood loss or risk of bleeding: not applicable  Complications:  None.  Findings:  3 cm L Ovary with a 4 cm cyst off the end of it.  It was not adhesed to anything but the descending bowel and sigmoid had adhesions to the anterior abdominal wall.  Appendix and cul de sac were otherwise normal.  The ureters were seen well out of all fields of dissection.  The R ovary was normal.  Technique:  After adequate general anesthesia was achieved, the patient was prepped and draped in sterile fashion.  The speculum was placed into the vagina and the uterine manipulator placed in the cervix. The speculum was removed and a catheter placed to drain the bladder during the surgery.  Attention was turned to the abdomen and a  2 cm incision was made above the umbilicus.  The veress needle passed into the abdomen without aspiration of bowel contents or blood.  The 12 mm trocar for the camera port was introduced after insuflatation and the above findings noted.  Two 8.5 mm trocars were introduced 10 cm either side of the camera port under direct visualization.  The  assist Airseal port was introduced 3 cm above the plane of the other ports. The PK forceps were introduced on arm 2 and the Hot Shears on arm 1.  Peritoneal washings were done at this point.    The adhesions of the sigmoid and colon were removed from the anterior abdominal wall with cautery and the L IP ligament identified.  The IP was isolated by incising the peritoneum above and parallel to it with the hot shears.  The IP was then cauterized in two places and incised with the scissors.  The ureter was seen well below th field of dissection.  The L ovary was then removed with careful cautery on the broad ligament careful to be well away from the structures underneath.  The uterine ovarian ligament was cauterized in 3 places and then incised with the scissors. The ovary was then placed in the anterior cul de sac.  Hemostasis was assured.  The Robot was then undocked.  The 8.5 mm scope was introduced and the ovary and tube were placed inside an endobag.  The bag was then brought through the umbilical incision and opened to the outside.  The smaller portion of the ovary and tube was removed with an allis clamp and the rest of the ovary decompressed with a scalpel.  Fluid, ovary and tube were all successfully removed from the abdomen without any intraperitoneal spill and sent to pathology.  Hemostasis was excellent.  All instruments were removed and the abdomen desuflated.  The 12 mm trocar site fascia was closed with a figure of eight stitch of 2-Vicryl.  All skin incisions were closed with subcuticular stitches and Dermabond.  All instruments were withdrawn from the vagina.  Pt tolerated the procedure well and was returned to the recovery room in stable condition.    Arora Coakley A

## 2016-03-12 NOTE — Transfer of Care (Signed)
Immediate Anesthesia Transfer of Care Note  Patient: Amber Patel  Procedure(s) Performed: Procedure(s): ROBOTIC ASSISTED BILATERAL SALPINGECTOMY  AND LEFT OOPHERECTOMY (Bilateral)  Patient Location: PACU  Anesthesia Type:General  Level of Consciousness: awake, alert  and oriented  Airway & Oxygen Therapy: Patient Spontanous Breathing and Patient connected to nasal cannula oxygen  Post-op Assessment: Report given to RN and Post -op Vital signs reviewed and stable  Post vital signs: Reviewed and stable  Last Vitals:  Filed Vitals:   03/12/16 0614  BP: 124/85  Pulse: 80  Temp: 36.7 C  Resp: 18    Last Pain: There were no vitals filed for this visit.    Patients Stated Pain Goal: 4 ([id number])  Complications: No apparent anesthesia complications

## 2016-03-15 ENCOUNTER — Encounter (HOSPITAL_COMMUNITY): Payer: Self-pay | Admitting: Obstetrics and Gynecology

## 2016-03-29 NOTE — Anesthesia Postprocedure Evaluation (Signed)
Anesthesia Post Note  Patient: Amber Patel  Procedure(s) Performed: Procedure(s) (LRB): ROBOTIC ASSISTED BILATERAL SALPINGECTOMY  AND LEFT OOPHERECTOMY (Bilateral)  Patient location during evaluation: PACU Anesthesia Type: General Level of consciousness: sedated Pain management: satisfactory to patient Vital Signs Assessment: post-procedure vital signs reviewed and stable Respiratory status: spontaneous breathing Cardiovascular status: stable Anesthetic complications: no     Last Vitals:  Filed Vitals:   03/12/16 1045 03/12/16 1125  BP: 107/67 126/79  Pulse: 54 60  Temp: 36.8 C 36.6 C  Resp: 16 16    Last Pain:  Filed Vitals:   03/15/16 1529  PainSc: 2    Pain Goal: Patients Stated Pain Goal: 4 (03/12/16 1125)               Darwyn Ponzo EDWARD

## 2016-08-21 ENCOUNTER — Encounter (HOSPITAL_COMMUNITY): Payer: Self-pay | Admitting: *Deleted

## 2016-08-21 ENCOUNTER — Ambulatory Visit (HOSPITAL_COMMUNITY)
Admission: EM | Admit: 2016-08-21 | Discharge: 2016-08-21 | Disposition: A | Discharge status: Home / Self Care | Payer: Managed Care, Other (non HMO) | Attending: Family Medicine | Admitting: Family Medicine

## 2016-08-21 DIAGNOSIS — S61432A Puncture wound without foreign body of left hand, initial encounter: Secondary | ICD-10-CM | POA: Diagnosis not present

## 2016-08-21 MED ORDER — TETANUS-DIPHTH-ACELL PERTUSSIS 5-2.5-18.5 LF-MCG/0.5 IM SUSP: 0.5000 mL | Freq: Once | INTRAMUSCULAR | Status: DC

## 2016-08-21 MED ORDER — TETANUS-DIPHTH-ACELL PERTUSSIS 5-2.5-18.5 LF-MCG/0.5 IM SUSP
INTRAMUSCULAR | Status: AC
  Filled 2016-08-21: qty 0.5
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
## 2016-08-21 NOTE — Discharge Instructions (Signed)
Keep clean, use bacitracin ointment 1-2 times a day, you had a tetanus booster, return if any concerns.

## 2016-09-04 ENCOUNTER — Encounter (HOSPITAL_COMMUNITY): Payer: Self-pay | Admitting: Emergency Medicine

## 2016-09-04 ENCOUNTER — Emergency Department (HOSPITAL_COMMUNITY)
Admission: EM | Admit: 2016-09-04 | Discharge: 2016-09-04 | Disposition: A | Discharge status: Home / Self Care | Payer: Managed Care, Other (non HMO) | Attending: Emergency Medicine | Admitting: Emergency Medicine

## 2016-09-04 DIAGNOSIS — Z203 Contact with and (suspected) exposure to rabies: Secondary | ICD-10-CM | POA: Insufficient documentation

## 2016-09-04 MED ORDER — RABIES IMMUNE GLOBULIN 150 UNIT/ML IM INJ: 20.0000 [IU]/kg | INJECTION | Freq: Once | INTRAMUSCULAR | Status: DC

## 2016-09-04 MED ORDER — RABIES VACCINE, PCEC IM SUSR
1.0000 mL | Freq: Once | INTRAMUSCULAR | Status: AC
  Administered 2016-09-04: 1 mL via INTRAMUSCULAR
  Filled 2016-09-04: qty 1

## 2016-09-04 NOTE — ED Notes (Signed)
Declined W/C at D/C and was escorted to lobby by RN. 

## 2016-09-04 NOTE — ED Provider Notes (Addendum)
[facility] DEPT Provider Note   CSN: [id number] Arrival date & time: 09/04/16  1229  By signing my name below, I, Amber Patel, attest that this documentation has been prepared under the direction and in the presence of [employer], PA-C.  Electronically Signed: Julien Patel, ED Scribe. 09/04/16. 3:51 PM.    History   Chief Complaint No chief complaint on file.   The history is provided by the patient. No language interpreter was used.   HPI Comments: Amber Patel is a 55 y.o. female who presents to the Emergency Department presenting for bat exposure last night. Pt was exposed to a bat last night in their home. She wanted to come for further evaluation and to receive a Rabies vaccine. She as no complaints or symptoms.   Past Medical History:  Diagnosis Date  . Baker's cyst of knee 04/12/12  . PSVT (paroxysmal supraventricular tachycardia) ([place])   . SOB (shortness of breath) on exertion 06/26/13   Has become more active & when she takes spinning classes, heart rate goes up to 200 and sometimes it is associated with chest tightness. Stress echo 08/13/13 = no signs of ischemia, however at the peak exercise a SVT with HR 215/minute was recorded.   . SVD (spontaneous vaginal delivery)    x 1  . Tachycardia 06/26/13    Patient Active Problem List   Diagnosis Date Noted  . SVT (supraventricular tachycardia) ([employer]) 08/20/2013    Past Surgical History:  Procedure Laterality Date  . CESAREAN SECTION    . COLONOSCOPY    . dislocated arm    . ROBOTIC ASSISTED BILATERAL SALPINGO OOPHERECTOMY Bilateral 03/12/2016   Procedure: ROBOTIC ASSISTED BILATERAL SALPINGECTOMY  AND LEFT OOPHERECTOMY;  Surgeon: Bobbye Charleston, MD;  Location: [facility];  Service: Gynecology;  Laterality: Bilateral;  . WISDOM TOOTH EXTRACTION      OB History    No data available       Home Medications    Prior to Admission medications   Medication Sig Start Date End Date Taking? Authorizing  Provider  ALPRAZolam Duanne Moron) 0.25 MG tablet Take 0.25 mg by mouth at bedtime as needed for sleep.    Historical Provider, MD  cetirizine (ZYRTEC) 10 MG tablet Take 10 mg by mouth as needed.     Historical Provider, MD  metoprolol tartrate (LOPRESSOR) 25 MG tablet Take 25 mg by mouth as directed. Take daily as needed for 1 hour prior to exercise    Historical Provider, MD  oxyCODONE-acetaminophen (ROXICET) 5-325 MG tablet Take 1 tablet by mouth every 4 (four) hours as needed for severe pain. 03/12/16   Bobbye Charleston, MD    Family History Family History  Problem Relation Age of Onset  . Prostate cancer Father   . Diabetes Maternal Grandfather     Social History Social History  Substance Use Topics  . Smoking status: Never Smoker  . Smokeless tobacco: Never Used  . Alcohol use 8.4 oz/week    14 Glasses of wine per week     Comment: 1 glass of wine per day, occasional mixed drink per day     Allergies   Patient has no known allergies.   Review of Systems Review of Systems  A complete 10 system review of systems was obtained and all systems are negative except as noted in the HPI and PMH.    Physical Exam Updated Vital Signs BP 139/65   Pulse 72   Temp 98.2 F (36.8 C) (Oral)   Resp 16  Ht 5\' 8"  (1.727 m)   Wt 156 lb 4.8 oz (70.9 kg)   LMP 11/26/2015 (Approximate)   SpO2 99%   BMI 23.77 kg/m   Physical Exam  Constitutional: She is oriented to person, place, and time. She appears well-developed and well-nourished.  HENT:  Head: Normocephalic.  Pulmonary/Chest: Effort normal.  Neurological: She is alert and oriented to person, place, and time.  Skin: Skin is warm and dry.     ED Treatments / Results  DIAGNOSTIC STUDIES: Oxygen Saturation is 99% on RA, normal by my interpretation.  COORDINATION OF CARE:  3:49 PM Discussed treatment plan with pt at bedside and pt agreed to plan.  Labs (all labs ordered are listed, but only abnormal results are  displayed) Labs Reviewed - No data to display  EKG  EKG Interpretation None       Radiology No results found.  Procedures Procedures (including critical care time)  Medications Ordered in ED Medications  rabies vaccine (RABAVERT) injection 1 mL (not administered)  rabies immune globulin (HYPERAB) injection 1,425 Units (not administered)     Initial Impression / Assessment and Plan / ED Course  I have reviewed the triage vital signs and the nursing notes.  Pertinent labs & imaging results that were available during my care of the patient were reviewed by me and considered in my medical decision making (see chart for details).  Clinical Course     Given rabies prophylaxis based on pharmacy recommendations  Final Clinical Impressions(s) / ED Diagnoses   Final diagnoses:  None   I personally performed the services described in this documentation, which was scribed in my presence. The recorded information has been reviewed and is accurate. New Prescriptions New Prescriptions   No medications on file     Dalia Heading, PA-C 09/05/16 [place], MD 09/07/16 [address], PA-C 09/30/16 [id number]    Fatima Blank, MD 10/01/16 1550

## 2016-09-04 NOTE — ED Triage Notes (Signed)
Pt states the bat was found in her living room, animal control suggested pt come get rabies shots. Pt had no contact with bat, but bat was in living room. Pt husband caught the bat.

## 2016-09-07 ENCOUNTER — Encounter (HOSPITAL_COMMUNITY): Payer: Self-pay | Admitting: Emergency Medicine

## 2016-09-07 ENCOUNTER — Ambulatory Visit (HOSPITAL_COMMUNITY)
Admission: EM | Admit: 2016-09-07 | Discharge: 2016-09-07 | Disposition: A | Discharge status: Home / Self Care | Payer: Managed Care, Other (non HMO) | Attending: Family Medicine | Admitting: Family Medicine

## 2016-09-07 DIAGNOSIS — Z203 Contact with and (suspected) exposure to rabies: Secondary | ICD-10-CM

## 2016-09-07 MED ORDER — RABIES VACCINE, PCEC IM SUSR
1.0000 mL | Freq: Once | INTRAMUSCULAR | Status: AC
  Administered 2016-09-07: 1 mL via INTRAMUSCULAR

## 2016-09-07 MED ORDER — RABIES VACCINE, PCEC IM SUSR
INTRAMUSCULAR | Status: AC
  Filled 2016-09-07: qty 1

## 2016-09-07 NOTE — ED Triage Notes (Signed)
Bat found in living room, seen in ed 12/23.  Here for rabies injection.  Day #3 in series.

## 2016-09-07 NOTE — Discharge Instructions (Signed)
Return as needed, return for next injection in the series

## 2016-09-14 ENCOUNTER — Other Ambulatory Visit: Payer: Self-pay | Admitting: Obstetrics and Gynecology

## 2016-09-15 LAB — CYTOLOGY - PAP

## 2016-12-14 ENCOUNTER — Encounter: Payer: Self-pay | Admitting: Internal Medicine

## 2016-12-29 ENCOUNTER — Encounter: Payer: Self-pay | Admitting: Internal Medicine

## 2016-12-29 ENCOUNTER — Ambulatory Visit (INDEPENDENT_AMBULATORY_CARE_PROVIDER_SITE_OTHER): Payer: Managed Care, Other (non HMO) | Admitting: Internal Medicine

## 2016-12-29 VITALS — BP 118/86 | HR 71 | Ht 68.0 in | Wt 155.6 lb

## 2016-12-29 DIAGNOSIS — I471 Supraventricular tachycardia: Secondary | ICD-10-CM | POA: Diagnosis not present

## 2016-12-29 MED ORDER — METOPROLOL TARTRATE 25 MG PO TABS: 25.0000 mg | ORAL_TABLET | ORAL | 2 refills | Status: DC

## 2016-12-29 NOTE — Progress Notes (Signed)
PCP:  REDMON,[name], PA-C Cardiologist: Meda Coffee  The patient presents today for routine electrophysiology followup.  Since last being seen in our clinic, the patient reports doing very well. She has moved to [place].  She is here visiting her son.  Tachycardia is well controlled.  She is no longer taking metoprolol.  She is pleased with her current health state.  Today, she denies symptoms of palpitations, chest pain, shortness of breath, orthopnea, PND, lower extremity edema, dizziness, presyncope, syncope, or neurologic sequela.  The patient feels that she is tolerating medications without difficulties and is otherwise without complaint today.   Past Medical History:  Diagnosis Date  . Baker's cyst of knee 04/12/12  . PSVT (paroxysmal supraventricular tachycardia) ([facility] [place])   . SOB (shortness of breath) on exertion 06/26/13   Has become more active & when she takes spinning classes, heart rate goes up to 200 and sometimes it is associated with chest tightness. Stress echo 08/13/13 = no signs of ischemia, however at the peak exercise a SVT with HR 215/minute was recorded.   . SVD (spontaneous vaginal delivery)    x 1  . Tachycardia 06/26/13   Past Surgical History:  Procedure Laterality Date  . CESAREAN SECTION    . COLONOSCOPY    . dislocated arm    . ROBOTIC ASSISTED BILATERAL SALPINGO OOPHERECTOMY Bilateral 03/12/2016   Procedure: ROBOTIC ASSISTED BILATERAL SALPINGECTOMY  AND LEFT OOPHERECTOMY;  Surgeon: Bobbye Charleston, MD;  Location: [facility];  Service: Gynecology;  Laterality: Bilateral;  . WISDOM TOOTH EXTRACTION      Current Outpatient Prescriptions  Medication Sig Dispense Refill  . ALPRAZolam (XANAX) 0.25 MG tablet Take 0.25 mg by mouth at bedtime as needed for sleep.    . cetirizine (ZYRTEC) 10 MG tablet Take 10 mg by mouth daily as needed for allergies or rhinitis.     Marland Kitchen zolpidem (AMBIEN) 5 MG tablet Take 5 mg by mouth as directed. Take as needed for overseas travel    .  metoprolol tartrate (LOPRESSOR) 25 MG tablet Take 25 mg by mouth as directed. Take daily as needed for 1 hour prior to exercise     No current facility-administered medications for this visit.     No Known Allergies  Social History   Social History  . Marital status: Single    Spouse name: N/A  . Number of children: N/A  . Years of education: N/A   Occupational History  . Not on file.   Social History Main Topics  . Smoking status: Never Smoker  . Smokeless tobacco: Never Used  . Alcohol use 8.4 oz/week    14 Glasses of wine per week     Comment: 1 glass of wine per day, occasional mixed drink per day  . Drug use: No  . Sexual activity: Yes    Birth control/ protection: Post-menopausal   Other Topics Concern  . Not on file   Social History Narrative   Lives in [place] with spouse and two childre (21,19)   Works as a Cytogeneticist    Family History  Problem Relation Age of Onset  . Atrial fibrillation Mother   . Hypertension Mother   . Prostate cancer Father   . Diabetes Maternal Grandfather     ROS-  All systems are reviewed and are negative except as outlined in the HPI above  Physical Exam: Vitals:   12/29/16 1110  BP: 118/86  Pulse: 71  SpO2: 97%  Weight: 155 lb 9.6 oz (70.6 kg)  Height: 5\' 8"  (1.727 m)    GEN- The patient is well appearing, alert and oriented x 3 today.   Head- normocephalic, atraumatic Eyes-  Sclera clear, conjunctiva pink Ears- hearing intact Oropharynx- clear Neck- supple,   Lungs- Clear to ausculation bilaterally, normal work of breathing Heart- Regular rate and rhythm, no murmurs, rubs or gallops, PMI not laterally displaced GI- soft, NT, ND, + BS Extremities- no clubbing, cyanosis, or edema Psych- euthymic mood, full affect Neuro- strength and sensation are intact  ekg today reveals sinus rhythm at 71 bpm, otherwise normal ekg  Assessment and Plan:  1. Tachycardia  The patient has symptoms of exertional  tachycardia. I have reviewed her event monitor previously which reveals only sinus rhythm. Her echo is normal.   She has done very well with metoprolol prn. No changes at this time  Return in 1 year  Thompson Grayer MD, [facility] 12/29/2016 5:04 PM

## 2016-12-29 NOTE — Patient Instructions (Signed)

## 2017-03-28 ENCOUNTER — Ambulatory Visit (INDEPENDENT_AMBULATORY_CARE_PROVIDER_SITE_OTHER): Payer: Managed Care, Other (non HMO)

## 2017-03-28 ENCOUNTER — Encounter: Payer: Self-pay | Admitting: Podiatry

## 2017-03-28 ENCOUNTER — Ambulatory Visit (INDEPENDENT_AMBULATORY_CARE_PROVIDER_SITE_OTHER): Payer: Managed Care, Other (non HMO) | Admitting: Podiatry

## 2017-03-28 DIAGNOSIS — M79673 Pain in unspecified foot: Secondary | ICD-10-CM | POA: Diagnosis not present

## 2017-03-28 DIAGNOSIS — M7741 Metatarsalgia, right foot: Secondary | ICD-10-CM

## 2017-03-28 DIAGNOSIS — M79671 Pain in right foot: Secondary | ICD-10-CM | POA: Diagnosis not present

## 2017-03-28 DIAGNOSIS — M2042 Other hammer toe(s) (acquired), left foot: Secondary | ICD-10-CM | POA: Diagnosis not present

## 2017-03-28 DIAGNOSIS — M79672 Pain in left foot: Secondary | ICD-10-CM

## 2017-03-28 DIAGNOSIS — M7742 Metatarsalgia, left foot: Secondary | ICD-10-CM | POA: Diagnosis not present

## 2017-03-28 DIAGNOSIS — M2041 Other hammer toe(s) (acquired), right foot: Secondary | ICD-10-CM

## 2017-03-28 NOTE — Progress Notes (Signed)
   Subjective:    Patient ID: Amber Patel, female    DOB: 11-18-1960, 56 y.o.   MRN: [id number]  HPI  Chief Complaint  Patient presents with  . Foot Pain    ball of feet BL/ using metarsal pads which helped/   . Numbness    BL tips of toes    56 year old female presents the office today for concerns of hammertoes and was discussed which help but hammertoes. She states that she does lateral walking shoes. The ball of her feet at times. She lives in [place] and since then she has into a lot more walking. Denies any recent injury or trauma. No swelling or redness. She also states that her left big toenails are to become ingrown denies any redness or drainage the area. She is no other concerns.   Review of Systems  Musculoskeletal: Positive for arthralgias and back pain.  All other systems reviewed and are negative.      Objective:   Physical Exam General: AAO x3, NAD  Dermatological: Skin is warm, dry and supple bilateral. Mild incurvation on the hallux toenail left side. No drainage or pus or any signs of infection. There are no open sores, no preulcerative lesions, no rash or signs of infection present.  Vascular: Dorsalis Pedis artery and Posterior Tibial artery pedal pulses are 2/4 bilateral with immedate capillary fill time. There is no pain with calf compression, swelling, warmth, erythema. Normal color to the digits and temperature.  Neruologic: Grossly intact via light touch bilateral. Vibratory intact via tuning fork bilateral. Protective threshold with Semmes Wienstein monofilament intact to all pedal sites bilateral. Some does have numbness to the tips the toes. Overall sensation intact.  Musculoskeletal: Mild ammertoes are present. Mild diffuse tenderness along the hammertoes as well as submetatarsal. There is no specific area pinpoint bony tenderness or pain the vibratory sensation. Muscular strength 5/5 in all groups tested bilateral.  Gait: Unassisted, Nonantalgic.       Assessment & Plan:56 year old female with metatarsalgia, hammertoes, ingrown toenail   -Treatment options discussed including all alternatives, risks, and complications -Etiology of symptoms were discussed -X-rays were obtained and reviewed with the patient. No evidence of acute fracture identified. -She's tried metatarsal/dancer pads without aseptic improvement. Discussed with the walking that she is doing she may benefit more from custom insert to help her arch support help slow the progression of hammertoes however this is not going to reverse it. Will try to have a metatarsal pad in this area. -Debrided left hallux toenail without any complication or bleeding. -Discussed shoe modifications.  Celesta Gentile, DPM

## 2017-04-14 ENCOUNTER — Ambulatory Visit: Payer: Managed Care, Other (non HMO) | Admitting: Orthotics

## 2017-06-17 ENCOUNTER — Telehealth: Payer: Self-pay | Admitting: Internal Medicine

## 2017-06-17 NOTE — Telephone Encounter (Signed)
Left message for patient that she should call her PCP to be seen as that symptom could be a number of things to nothing at all.  If the PCP feels it's of cardiac nature, they can call and speak to DOD to discuss POC.  Let her know I was here all day if she wanted to call me back.

## 2017-06-17 NOTE — Telephone Encounter (Signed)
°  New Prob   Pt states she has been experiencing a tingling sensation/pins and needles down her L arm that has been ongoing since yesterday. Requesting to speak to a nurse. Please call.

## 2017-07-19 DIAGNOSIS — M9905 Segmental and somatic dysfunction of pelvic region: Secondary | ICD-10-CM | POA: Diagnosis not present

## 2017-07-19 DIAGNOSIS — M9903 Segmental and somatic dysfunction of lumbar region: Secondary | ICD-10-CM | POA: Diagnosis not present

## 2017-07-19 DIAGNOSIS — M9902 Segmental and somatic dysfunction of thoracic region: Secondary | ICD-10-CM | POA: Diagnosis not present

## 2017-07-19 DIAGNOSIS — M9904 Segmental and somatic dysfunction of sacral region: Secondary | ICD-10-CM | POA: Diagnosis not present

## 2017-08-02 DIAGNOSIS — M9905 Segmental and somatic dysfunction of pelvic region: Secondary | ICD-10-CM | POA: Diagnosis not present

## 2017-08-02 DIAGNOSIS — M9903 Segmental and somatic dysfunction of lumbar region: Secondary | ICD-10-CM | POA: Diagnosis not present

## 2017-08-02 DIAGNOSIS — M9902 Segmental and somatic dysfunction of thoracic region: Secondary | ICD-10-CM | POA: Diagnosis not present

## 2017-08-02 DIAGNOSIS — M9904 Segmental and somatic dysfunction of sacral region: Secondary | ICD-10-CM | POA: Diagnosis not present

## 2017-08-05 DIAGNOSIS — M9905 Segmental and somatic dysfunction of pelvic region: Secondary | ICD-10-CM | POA: Diagnosis not present

## 2017-08-05 DIAGNOSIS — M9904 Segmental and somatic dysfunction of sacral region: Secondary | ICD-10-CM | POA: Diagnosis not present

## 2017-08-05 DIAGNOSIS — M9902 Segmental and somatic dysfunction of thoracic region: Secondary | ICD-10-CM | POA: Diagnosis not present

## 2017-08-05 DIAGNOSIS — M9903 Segmental and somatic dysfunction of lumbar region: Secondary | ICD-10-CM | POA: Diagnosis not present

## 2017-08-08 DIAGNOSIS — M9903 Segmental and somatic dysfunction of lumbar region: Secondary | ICD-10-CM | POA: Diagnosis not present

## 2017-08-08 DIAGNOSIS — M9905 Segmental and somatic dysfunction of pelvic region: Secondary | ICD-10-CM | POA: Diagnosis not present

## 2017-08-08 DIAGNOSIS — M9902 Segmental and somatic dysfunction of thoracic region: Secondary | ICD-10-CM | POA: Diagnosis not present

## 2017-08-08 DIAGNOSIS — M9904 Segmental and somatic dysfunction of sacral region: Secondary | ICD-10-CM | POA: Diagnosis not present

## 2017-08-12 DIAGNOSIS — M9902 Segmental and somatic dysfunction of thoracic region: Secondary | ICD-10-CM | POA: Diagnosis not present

## 2017-08-12 DIAGNOSIS — M9903 Segmental and somatic dysfunction of lumbar region: Secondary | ICD-10-CM | POA: Diagnosis not present

## 2017-08-12 DIAGNOSIS — M9904 Segmental and somatic dysfunction of sacral region: Secondary | ICD-10-CM | POA: Diagnosis not present

## 2017-08-12 DIAGNOSIS — M9905 Segmental and somatic dysfunction of pelvic region: Secondary | ICD-10-CM | POA: Diagnosis not present

## 2017-08-16 DIAGNOSIS — C44719 Basal cell carcinoma of skin of left lower limb, including hip: Secondary | ICD-10-CM | POA: Diagnosis not present

## 2017-08-16 DIAGNOSIS — L570 Actinic keratosis: Secondary | ICD-10-CM | POA: Diagnosis not present

## 2017-08-16 DIAGNOSIS — Z85828 Personal history of other malignant neoplasm of skin: Secondary | ICD-10-CM | POA: Diagnosis not present

## 2017-08-16 DIAGNOSIS — L738 Other specified follicular disorders: Secondary | ICD-10-CM | POA: Diagnosis not present

## 2017-08-30 DIAGNOSIS — Z1231 Encounter for screening mammogram for malignant neoplasm of breast: Secondary | ICD-10-CM | POA: Diagnosis not present

## 2017-08-30 DIAGNOSIS — Z6823 Body mass index (BMI) 23.0-23.9, adult: Secondary | ICD-10-CM | POA: Diagnosis not present

## 2017-09-08 DIAGNOSIS — M9902 Segmental and somatic dysfunction of thoracic region: Secondary | ICD-10-CM | POA: Diagnosis not present

## 2017-09-08 DIAGNOSIS — M9905 Segmental and somatic dysfunction of pelvic region: Secondary | ICD-10-CM | POA: Diagnosis not present

## 2017-09-08 DIAGNOSIS — M9904 Segmental and somatic dysfunction of sacral region: Secondary | ICD-10-CM | POA: Diagnosis not present

## 2017-09-08 DIAGNOSIS — M9903 Segmental and somatic dysfunction of lumbar region: Secondary | ICD-10-CM | POA: Diagnosis not present

## 2017-10-10 DIAGNOSIS — R5383 Other fatigue: Secondary | ICD-10-CM | POA: Diagnosis not present

## 2017-10-10 DIAGNOSIS — K5900 Constipation, unspecified: Secondary | ICD-10-CM | POA: Diagnosis not present

## 2017-10-31 DIAGNOSIS — M9903 Segmental and somatic dysfunction of lumbar region: Secondary | ICD-10-CM | POA: Diagnosis not present

## 2017-10-31 DIAGNOSIS — M9902 Segmental and somatic dysfunction of thoracic region: Secondary | ICD-10-CM | POA: Diagnosis not present

## 2017-10-31 DIAGNOSIS — M9904 Segmental and somatic dysfunction of sacral region: Secondary | ICD-10-CM | POA: Diagnosis not present

## 2017-10-31 DIAGNOSIS — M9905 Segmental and somatic dysfunction of pelvic region: Secondary | ICD-10-CM | POA: Diagnosis not present

## 2017-11-21 DIAGNOSIS — L570 Actinic keratosis: Secondary | ICD-10-CM | POA: Diagnosis not present

## 2017-11-30 DIAGNOSIS — R1032 Left lower quadrant pain: Secondary | ICD-10-CM | POA: Diagnosis not present

## 2017-11-30 DIAGNOSIS — R198 Other specified symptoms and signs involving the digestive system and abdomen: Secondary | ICD-10-CM | POA: Diagnosis not present

## 2017-11-30 DIAGNOSIS — R350 Frequency of micturition: Secondary | ICD-10-CM | POA: Diagnosis not present

## 2017-11-30 DIAGNOSIS — R140 Abdominal distension (gaseous): Secondary | ICD-10-CM | POA: Diagnosis not present

## 2017-12-21 ENCOUNTER — Other Ambulatory Visit: Payer: Self-pay | Admitting: Physician Assistant

## 2017-12-21 DIAGNOSIS — Z1211 Encounter for screening for malignant neoplasm of colon: Secondary | ICD-10-CM | POA: Diagnosis not present

## 2017-12-21 DIAGNOSIS — R198 Other specified symptoms and signs involving the digestive system and abdomen: Secondary | ICD-10-CM | POA: Diagnosis not present

## 2017-12-21 DIAGNOSIS — R1032 Left lower quadrant pain: Secondary | ICD-10-CM

## 2017-12-22 DIAGNOSIS — Z1211 Encounter for screening for malignant neoplasm of colon: Secondary | ICD-10-CM | POA: Diagnosis not present

## 2017-12-22 DIAGNOSIS — Z01818 Encounter for other preprocedural examination: Secondary | ICD-10-CM | POA: Diagnosis not present

## 2017-12-29 ENCOUNTER — Ambulatory Visit
Admission: RE | Admit: 2017-12-29 | Discharge: 2017-12-29 | Disposition: A | Discharge status: Home / Self Care | Payer: Managed Care, Other (non HMO) | Source: Ambulatory Visit | Attending: Physician Assistant | Admitting: Physician Assistant

## 2017-12-29 DIAGNOSIS — R1032 Left lower quadrant pain: Secondary | ICD-10-CM

## 2017-12-29 DIAGNOSIS — R198 Other specified symptoms and signs involving the digestive system and abdomen: Secondary | ICD-10-CM

## 2017-12-29 MED ORDER — IOPAMIDOL (ISOVUE-300) INJECTION 61%
100.0000 mL | Freq: Once | INTRAVENOUS | Status: AC | PRN
  Administered 2017-12-29: 100 mL via INTRAVENOUS

## 2018-05-02 DIAGNOSIS — Z0000 Encounter for general adult medical examination without abnormal findings: Secondary | ICD-10-CM | POA: Diagnosis not present

## 2018-05-02 DIAGNOSIS — Z136 Encounter for screening for cardiovascular disorders: Secondary | ICD-10-CM | POA: Diagnosis not present

## 2018-10-11 ENCOUNTER — Telehealth: Payer: Self-pay | Admitting: Internal Medicine

## 2018-10-11 NOTE — Telephone Encounter (Signed)
New message     .STAT if patient feels like he/she is going to faint   1) Are you dizzy now? No , when sitting down and getting up   2) Do you feel faint or have you passed out? No   3) Do you have any other symptoms? Lightheadedness ,   4) Have you checked your HR and BP (record if available)? Hr has dropped to 54 via fit bit at night when you sleep

## 2018-10-12 NOTE — Telephone Encounter (Signed)
Pt reports that she has experienced light-headedness that last few days.   Reports her normal resting HR has gone from 64/65 down to 60. Reports HR have been dipping down to 54/55 during sleep, according to her Fitbit.  She does report that she has been doing more "hip training" recently.  Also states that she is experiencing lightheadedness after changing positions from sitting to standing.  Informed pt that her resting HRs and sleeping HRs are not abnormal.  Educated that if she begins to experience symptoms w/ the low HRs and/or it is waking her up at night with symptoms, to call the office to discuss further. Advised to change positions slowly and increase her fluid intake if dehydrated.  Advised to f/u w/ PCP, for evaluation, about possible orthostatic issues if continues.  Patient verbalized understanding and agreeable to plan.

## 2018-11-20 ENCOUNTER — Ambulatory Visit (INDEPENDENT_AMBULATORY_CARE_PROVIDER_SITE_OTHER): Payer: BLUE CROSS/BLUE SHIELD | Admitting: Internal Medicine

## 2018-11-20 ENCOUNTER — Encounter: Payer: Self-pay | Admitting: Internal Medicine

## 2018-11-20 VITALS — BP 120/76 | HR 69 | Ht 68.0 in | Wt 152.0 lb

## 2018-11-20 DIAGNOSIS — R000 Tachycardia, unspecified: Secondary | ICD-10-CM

## 2018-11-20 DIAGNOSIS — E7800 Pure hypercholesterolemia, unspecified: Secondary | ICD-10-CM | POA: Diagnosis not present

## 2018-11-20 NOTE — Progress Notes (Signed)
PCP:  Lennie Odor, PA-C Cardiologist: Meda Coffee  The patient presents today for routine electrophysiology followup.  Since last being seen in our clinic, the patient reports doing very well. Her tachycardia appears to be well controlled at this point. She brings in heart rates from her FitBit during activity which show appropriate physiologic response. She is not taking BB at this point. She did have some dizziness at the beginning of the year which has resolved.   Today, she denies symptoms of palpitations, chest pain, shortness of breath, orthopnea, PND, lower extremity edema, dizziness, presyncope, syncope, or neurologic sequela.  The patient feels that she is tolerating medications without difficulties and is otherwise without complaint today.   Past Medical History:  Diagnosis Date  . Baker's cyst of knee 04/12/12  . PSVT (paroxysmal supraventricular tachycardia) ([place])   . SOB (shortness of breath) on exertion 06/26/13   Has become more active & when she takes spinning classes, heart rate goes up to 200 and sometimes it is associated with chest tightness. Stress echo 08/13/13 = no signs of ischemia, however at the peak exercise a SVT with HR 215/minute was recorded.   . SVD (spontaneous vaginal delivery)    x 1  . Tachycardia 06/26/13   Past Surgical History:  Procedure Laterality Date  . CESAREAN SECTION    . COLONOSCOPY    . dislocated arm    . ROBOTIC ASSISTED BILATERAL SALPINGO OOPHERECTOMY Bilateral 03/12/2016   Procedure: ROBOTIC ASSISTED BILATERAL SALPINGECTOMY  AND LEFT OOPHERECTOMY;  Surgeon: Bobbye Charleston, MD;  Location: [facility];  Service: Gynecology;  Laterality: Bilateral;  . WISDOM TOOTH EXTRACTION      Current Outpatient Medications  Medication Sig Dispense Refill  . ALPRAZolam (XANAX) 0.25 MG tablet Take 0.25 mg by mouth at bedtime as needed for sleep (prn when flying).     . zolpidem (AMBIEN) 5 MG tablet Take 5 mg by mouth as directed. Take as needed for overseas  travel     No current facility-administered medications for this visit.     No Known Allergies  Social History   Socioeconomic History  . Marital status: Single    Spouse name: Not on file  . Number of children: Not on file  . Years of education: Not on file  . Highest education level: Not on file  Occupational History  . Not on file  Social Needs  . Financial resource strain: Not on file  . Food insecurity:    Worry: Not on file    Inability: Not on file  . Transportation needs:    Medical: Not on file    Non-medical: Not on file  Tobacco Use  . Smoking status: Never Smoker  . Smokeless tobacco: Never Used  Substance and Sexual Activity  . Alcohol use: Yes    Alcohol/week: 14.0 standard drinks    Types: 14 Glasses of wine per week    Comment: 1 glass of wine per day, occasional mixed drink per day  . Drug use: No  . Sexual activity: Yes    Birth control/protection: Post-menopausal  Lifestyle  . Physical activity:    Days per week: Not on file    Minutes per session: Not on file  . Stress: Not on file  Relationships  . Social connections:    Talks on phone: Not on file    Gets together: Not on file    Attends religious service: Not on file    Active member of club or organization: Not on  file    Attends meetings of clubs or organizations: Not on file    Relationship status: Not on file  . Intimate partner violence:    Fear of current or ex partner: Not on file    Emotionally abused: Not on file    Physically abused: Not on file    Forced sexual activity: Not on file  Other Topics Concern  . Not on file  Social History Narrative   Lives in [place] with spouse and two childre (23,19)   Works as a Cytogeneticist    Family History  Problem Relation Age of Onset  . Atrial fibrillation Mother   . Hypertension Mother   . Prostate cancer Father   . Diabetes Maternal Grandfather     ROS-  All systems are reviewed and are negative except as outlined  in the HPI above  Physical Exam: Vitals:   11/20/18 0955  BP: 120/76  Pulse: 69  SpO2: 98%  Weight: 152 lb (68.9 kg)  Height: 5\' 8"  (1.727 m)    GEN- The patient is well appearing, alert and oriented x 3 today.   HEENT-head normocephalic, atraumatic, sclera clear, conjunctiva pink, hearing intact, trachea midline. Lungs- Clear to ausculation bilaterally, normal work of breathing Heart- Regular rate and rhythm, no murmurs, rubs or gallops  GI- soft, NT, ND, + BS Extremities- no clubbing, cyanosis, or edema MS- no significant deformity or atrophy Skin- no rash or lesion Psych- euthymic mood, full affect Neuro- strength and sensation are intact   ekg today reveals sinus rhythm HR 69  Assessment and Plan:  1. Tachycardia  Patient has not had any recent symptoms of tachycardia. Has not required her PRN BB. We did discuss general lifestyle changes for risk factor modification including continuing regular physical activity, decreasing alcohol intake, and treating cholesterol.  No changes at this time  Follow up in 1 year  Thompson Grayer MD, [facility] 11/20/2018 10:48 AM

## 2018-11-20 NOTE — Patient Instructions (Signed)
Medication Instructions:  Your physician recommends that you continue on your current medications as directed. Please refer to the Current Medication list given to you today.  Labwork: None ordered.  Testing/Procedures: None ordered.  Follow-Up: Your physician recommends that you schedule a follow-up appointment in:   One Year with Tommye Standard.  Any Other Special Instructions Will Be Listed Below (If Applicable).     If you need a refill on your cardiac medications before your next appointment, please call your pharmacy.

## 2018-11-22 NOTE — Addendum Note (Signed)
Addended by: Rose Phi on: 11/22/2018 02:20 PM   Modules accepted: Orders

## 2018-11-24 ENCOUNTER — Ambulatory Visit: Payer: BLUE CROSS/BLUE SHIELD | Admitting: Internal Medicine

## 2019-01-04 DIAGNOSIS — M7701 Medial epicondylitis, right elbow: Secondary | ICD-10-CM | POA: Diagnosis not present

## 2019-01-24 DIAGNOSIS — M9905 Segmental and somatic dysfunction of pelvic region: Secondary | ICD-10-CM | POA: Diagnosis not present

## 2019-01-24 DIAGNOSIS — M5386 Other specified dorsopathies, lumbar region: Secondary | ICD-10-CM | POA: Diagnosis not present

## 2019-01-24 DIAGNOSIS — M9904 Segmental and somatic dysfunction of sacral region: Secondary | ICD-10-CM | POA: Diagnosis not present

## 2019-01-24 DIAGNOSIS — M9902 Segmental and somatic dysfunction of thoracic region: Secondary | ICD-10-CM | POA: Diagnosis not present

## 2019-01-29 DIAGNOSIS — M9905 Segmental and somatic dysfunction of pelvic region: Secondary | ICD-10-CM | POA: Diagnosis not present

## 2019-01-29 DIAGNOSIS — M5386 Other specified dorsopathies, lumbar region: Secondary | ICD-10-CM | POA: Diagnosis not present

## 2019-01-29 DIAGNOSIS — M9902 Segmental and somatic dysfunction of thoracic region: Secondary | ICD-10-CM | POA: Diagnosis not present

## 2019-01-29 DIAGNOSIS — M9904 Segmental and somatic dysfunction of sacral region: Secondary | ICD-10-CM | POA: Diagnosis not present

## 2019-02-01 DIAGNOSIS — M9905 Segmental and somatic dysfunction of pelvic region: Secondary | ICD-10-CM | POA: Diagnosis not present

## 2019-02-01 DIAGNOSIS — M5386 Other specified dorsopathies, lumbar region: Secondary | ICD-10-CM | POA: Diagnosis not present

## 2019-02-01 DIAGNOSIS — M9904 Segmental and somatic dysfunction of sacral region: Secondary | ICD-10-CM | POA: Diagnosis not present

## 2019-02-01 DIAGNOSIS — M9902 Segmental and somatic dysfunction of thoracic region: Secondary | ICD-10-CM | POA: Diagnosis not present

## 2019-02-09 DIAGNOSIS — M9905 Segmental and somatic dysfunction of pelvic region: Secondary | ICD-10-CM | POA: Diagnosis not present

## 2019-02-09 DIAGNOSIS — M9904 Segmental and somatic dysfunction of sacral region: Secondary | ICD-10-CM | POA: Diagnosis not present

## 2019-02-09 DIAGNOSIS — M9902 Segmental and somatic dysfunction of thoracic region: Secondary | ICD-10-CM | POA: Diagnosis not present

## 2019-02-09 DIAGNOSIS — M5386 Other specified dorsopathies, lumbar region: Secondary | ICD-10-CM | POA: Diagnosis not present

## 2019-02-14 DIAGNOSIS — M5386 Other specified dorsopathies, lumbar region: Secondary | ICD-10-CM | POA: Diagnosis not present

## 2019-02-14 DIAGNOSIS — M9905 Segmental and somatic dysfunction of pelvic region: Secondary | ICD-10-CM | POA: Diagnosis not present

## 2019-02-14 DIAGNOSIS — M9904 Segmental and somatic dysfunction of sacral region: Secondary | ICD-10-CM | POA: Diagnosis not present

## 2019-02-14 DIAGNOSIS — M9902 Segmental and somatic dysfunction of thoracic region: Secondary | ICD-10-CM | POA: Diagnosis not present

## 2019-02-19 DIAGNOSIS — M9904 Segmental and somatic dysfunction of sacral region: Secondary | ICD-10-CM | POA: Diagnosis not present

## 2019-02-19 DIAGNOSIS — M9902 Segmental and somatic dysfunction of thoracic region: Secondary | ICD-10-CM | POA: Diagnosis not present

## 2019-02-19 DIAGNOSIS — M9905 Segmental and somatic dysfunction of pelvic region: Secondary | ICD-10-CM | POA: Diagnosis not present

## 2019-02-19 DIAGNOSIS — M5386 Other specified dorsopathies, lumbar region: Secondary | ICD-10-CM | POA: Diagnosis not present

## 2019-03-09 DIAGNOSIS — Z20828 Contact with and (suspected) exposure to other viral communicable diseases: Secondary | ICD-10-CM | POA: Diagnosis not present

## 2019-03-21 DIAGNOSIS — R262 Difficulty in walking, not elsewhere classified: Secondary | ICD-10-CM | POA: Diagnosis not present

## 2019-03-21 DIAGNOSIS — M6281 Muscle weakness (generalized): Secondary | ICD-10-CM | POA: Diagnosis not present

## 2019-03-21 DIAGNOSIS — M25552 Pain in left hip: Secondary | ICD-10-CM | POA: Diagnosis not present

## 2019-03-26 DIAGNOSIS — M25552 Pain in left hip: Secondary | ICD-10-CM | POA: Diagnosis not present

## 2019-03-26 DIAGNOSIS — R262 Difficulty in walking, not elsewhere classified: Secondary | ICD-10-CM | POA: Diagnosis not present

## 2019-03-26 DIAGNOSIS — M6281 Muscle weakness (generalized): Secondary | ICD-10-CM | POA: Diagnosis not present

## 2019-03-29 DIAGNOSIS — R262 Difficulty in walking, not elsewhere classified: Secondary | ICD-10-CM | POA: Diagnosis not present

## 2019-03-29 DIAGNOSIS — M25552 Pain in left hip: Secondary | ICD-10-CM | POA: Diagnosis not present

## 2019-03-29 DIAGNOSIS — M6281 Muscle weakness (generalized): Secondary | ICD-10-CM | POA: Diagnosis not present

## 2019-04-02 DIAGNOSIS — M25552 Pain in left hip: Secondary | ICD-10-CM | POA: Diagnosis not present

## 2019-04-02 DIAGNOSIS — M6281 Muscle weakness (generalized): Secondary | ICD-10-CM | POA: Diagnosis not present

## 2019-04-02 DIAGNOSIS — R262 Difficulty in walking, not elsewhere classified: Secondary | ICD-10-CM | POA: Diagnosis not present

## 2019-04-04 DIAGNOSIS — M9902 Segmental and somatic dysfunction of thoracic region: Secondary | ICD-10-CM | POA: Diagnosis not present

## 2019-04-04 DIAGNOSIS — M9904 Segmental and somatic dysfunction of sacral region: Secondary | ICD-10-CM | POA: Diagnosis not present

## 2019-04-04 DIAGNOSIS — M5386 Other specified dorsopathies, lumbar region: Secondary | ICD-10-CM | POA: Diagnosis not present

## 2019-04-04 DIAGNOSIS — M9905 Segmental and somatic dysfunction of pelvic region: Secondary | ICD-10-CM | POA: Diagnosis not present

## 2019-10-22 ENCOUNTER — Ambulatory Visit: Payer: BLUE CROSS/BLUE SHIELD | Attending: Internal Medicine

## 2019-10-24 ENCOUNTER — Ambulatory Visit: Payer: BLUE CROSS/BLUE SHIELD | Attending: Internal Medicine

## 2019-10-24 DIAGNOSIS — Z20822 Contact with and (suspected) exposure to covid-19: Secondary | ICD-10-CM

## 2019-10-25 LAB — NOVEL CORONAVIRUS, NAA: SARS-CoV-2, NAA: NOT DETECTED

## 2019-11-15 ENCOUNTER — Other Ambulatory Visit: Payer: BLUE CROSS/BLUE SHIELD

## 2019-11-16 ENCOUNTER — Other Ambulatory Visit: Payer: BLUE CROSS/BLUE SHIELD

## 2019-11-18 ENCOUNTER — Ambulatory Visit: Payer: BLUE CROSS/BLUE SHIELD | Attending: Internal Medicine

## 2019-11-18 DIAGNOSIS — Z23 Encounter for immunization: Secondary | ICD-10-CM

## 2019-11-18 NOTE — Progress Notes (Signed)
   Covid-19 Vaccination Clinic  Name:  Araiya Tramonte    MRN: [id number] DOB: 13-Oct-1960  11/18/2019  Ms. Rauf was observed post Covid-19 immunization for 15 minutes without incident. She was provided with Vaccine Information Sheet and instruction to access the V-Safe system.   Ms. Piela was instructed to call 911 with any severe reactions post vaccine: Marland Kitchen Difficulty breathing  . Swelling of face and throat  . A fast heartbeat  . A bad rash all over body  . Dizziness and weakness   Immunizations Administered    Name Date Dose VIS Date Route   Pfizer COVID-19 Vaccine 11/18/2019  8:31 AM 0.3 mL 08/24/2019 Intramuscular   Manufacturer: [employer]   Lot: [id number]   [place]: [id number]

## 2019-12-18 ENCOUNTER — Ambulatory Visit: Payer: BLUE CROSS/BLUE SHIELD | Attending: Internal Medicine

## 2019-12-18 DIAGNOSIS — Z23 Encounter for immunization: Secondary | ICD-10-CM

## 2019-12-18 NOTE — Progress Notes (Signed)
   Covid-19 Vaccination Clinic  Name:  Amber Patel    MRN: [id number] DOB: [birth year]  12/18/2019  Amber Patel was observed post Covid-19 immunization for 15 minutes without incident. She was provided with Vaccine Information Sheet and instruction to access the V-Safe system.   Amber Patel was instructed to call 911 with any severe reactions post vaccine: Marland Kitchen Difficulty breathing  . Swelling of face and throat  . A fast heartbeat  . A bad rash all over body  . Dizziness and weakness   Immunizations Administered    Name Date Dose VIS Date Route   Pfizer COVID-19 Vaccine 12/18/2019 11:33 AM 0.3 mL 08/24/2019 Intramuscular   Manufacturer: [employer], [employer]   Lot: [id number]   [place]: [id number]

## 2020-02-29 ENCOUNTER — Other Ambulatory Visit: Payer: Self-pay | Admitting: Obstetrics and Gynecology

## 2020-02-29 DIAGNOSIS — R928 Other abnormal and inconclusive findings on diagnostic imaging of breast: Secondary | ICD-10-CM

## 2020-03-12 ENCOUNTER — Ambulatory Visit
Admission: RE | Admit: 2020-03-12 | Discharge: 2020-03-12 | Disposition: A | Discharge status: Home / Self Care | Payer: BLUE CROSS/BLUE SHIELD | Source: Ambulatory Visit | Attending: Obstetrics and Gynecology | Admitting: Obstetrics and Gynecology

## 2020-03-12 ENCOUNTER — Other Ambulatory Visit: Payer: Self-pay

## 2020-03-12 DIAGNOSIS — R928 Other abnormal and inconclusive findings on diagnostic imaging of breast: Secondary | ICD-10-CM

## 2020-05-01 DIAGNOSIS — M5440 Lumbago with sciatica, unspecified side: Secondary | ICD-10-CM | POA: Insufficient documentation

## 2020-07-28 ENCOUNTER — Ambulatory Visit: Payer: Self-pay

## 2020-07-28 ENCOUNTER — Ambulatory Visit (INDEPENDENT_AMBULATORY_CARE_PROVIDER_SITE_OTHER): Payer: BLUE CROSS/BLUE SHIELD | Admitting: Podiatry

## 2020-07-28 ENCOUNTER — Other Ambulatory Visit: Payer: Self-pay

## 2020-07-28 ENCOUNTER — Encounter: Payer: Self-pay | Admitting: Podiatry

## 2020-07-28 DIAGNOSIS — M2042 Other hammer toe(s) (acquired), left foot: Secondary | ICD-10-CM

## 2020-07-28 DIAGNOSIS — M2041 Other hammer toe(s) (acquired), right foot: Secondary | ICD-10-CM

## 2020-07-28 DIAGNOSIS — M779 Enthesopathy, unspecified: Secondary | ICD-10-CM | POA: Diagnosis not present

## 2020-07-28 DIAGNOSIS — M7742 Metatarsalgia, left foot: Secondary | ICD-10-CM

## 2020-07-28 DIAGNOSIS — R1900 Intra-abdominal and pelvic swelling, mass and lump, unspecified site: Secondary | ICD-10-CM | POA: Insufficient documentation

## 2020-07-28 DIAGNOSIS — M7741 Metatarsalgia, right foot: Secondary | ICD-10-CM | POA: Diagnosis not present

## 2020-08-05 NOTE — Progress Notes (Signed)
Subjective:   Patient ID: Amber Patel, female   DOB: 59 y.o.   MRN: [id number]   HPI 59 year old female presents the office today requesting orthotics.  She states that it was also very beneficial with her dog recently chewed her pair of orthotics and she had to get a new set.  She states that not wearing them she is ready get hip and back pain she also intermittent pain in the ball of her foot.  No recent injury or falls or changes.  No swelling.  No other concerns today.   Review of Systems  All other systems reviewed and are negative.  Past Medical History:  Diagnosis Date  . Baker's cyst of knee 04/12/12  . PSVT (paroxysmal supraventricular tachycardia) ([place])   . SOB (shortness of breath) on exertion 06/26/13   Has become more active & when she takes spinning classes, heart rate goes up to 200 and sometimes it is associated with chest tightness. Stress echo 08/13/13 = no signs of ischemia, however at the peak exercise a SVT with HR 215/minute was recorded.   . SVD (spontaneous vaginal delivery)    x 1  . Tachycardia 06/26/13    Past Surgical History:  Procedure Laterality Date  . CESAREAN SECTION    . COLONOSCOPY    . dislocated arm    . ROBOTIC ASSISTED BILATERAL SALPINGO OOPHERECTOMY Bilateral 03/12/2016   Procedure: ROBOTIC ASSISTED BILATERAL SALPINGECTOMY  AND LEFT OOPHERECTOMY;  Surgeon: Bobbye Charleston, MD;  Location: [facility];  Service: Gynecology;  Laterality: Bilateral;  . WISDOM TOOTH EXTRACTION       Current Outpatient Medications:  .  ALPRAZolam (XANAX) 0.25 MG tablet, Take 0.25 mg by mouth at bedtime as needed for sleep (prn when flying). , Disp: , Rfl:  .  cyclobenzaprine (FLEXERIL) 10 MG tablet, Take by mouth., Disp: , Rfl:  .  gabapentin (NEURONTIN) 300 MG capsule, Take 300 mg by mouth daily., Disp: , Rfl:  .  Hyoscyamine Sulfate SL 0.125 MG SUBL, hyoscyamine 0.125 mg sublingual tablet, Disp: , Rfl:  .  methocarbamol (ROBAXIN) 500 MG tablet, Take 500 mg by mouth  2 (two) times daily., Disp: , Rfl:  .  mupirocin ointment (BACTROBAN) 2 %, mupirocin 2 % topical ointment  APPLY TO AFFECTED AREA EVERY DAY, Disp: , Rfl:  .  norethindrone-ethinyl estradiol (CYCLAFEM) 0.5/0.75/1-35 MG-MCG tablet, Nortrel 1/35 (28) 1 mg-35 mcg tablet, Disp: , Rfl:  .  tretinoin (RETIN-A) 0.05 % cream, tretinoin 0.05 % topical cream  APPLY PEA SIZED AMOUNT TO FACE AT BEDTIME FOR COSMETIC PURPOSES, Disp: , Rfl:  .  zolpidem (AMBIEN) 5 MG tablet, Take 5 mg by mouth as directed. Take as needed for overseas travel, Disp: , Rfl:   No Known Allergies      Objective:  Physical Exam  General: AAO x3, NAD  Dermatological: Skin is warm, dry and supple bilateral. There are no open sores, no preulcerative lesions, no rash or signs of infection present.  Vascular: Dorsalis Pedis artery and Posterior Tibial artery pedal pulses are 2/4 bilateral with immedate capillary fill time. There is no pain with calf compression, swelling, warmth, erythema.   Neruologic: Grossly intact via light touch bilateral.   Musculoskeletal: Subjectively showed a tenderness submetatarsal area but no area pinpoint tenderness identified today.  Flexor, extensor tendons appear to be intact.  Muscular strength 5/5 in all groups tested bilateral.  Mild hammertoes present.  Gait: Unassisted, Nonantalgic.       Assessment:   59 year old female with  gait abnormality, metatarsalgia/capsulitis    Plan:  -Treatment options discussed including all alternatives, risks, and complications -Etiology of symptoms were discussed -She was measured for new orthotics today with Liliane Channel, our pedorthotist. -Discussed shoe modifications  Trula Slade DPM

## 2020-08-27 NOTE — Progress Notes (Addendum)
Cardiology Office Note Date:  08/28/2020  Patient ID:  Amber Patel, DOB 09-Sep-1961, MRN [id number] PCP:  Lennie Odor, [place]  Cardiologist:  Dr. Meda Coffee (2014) Electrophysiologist: Dr. Rayann Heman    Chief Complaint:  annual visit  History of Present Illness: Amber Patel is a 58 y.o. female with history of tachycardia (chart notes: 08/13/13 = no signs of ischemia, however at the peak exercise a SVT with HR 215/minute was recorded)  She comes in today to be seen for Dr. Rayann Heman, last seen by him march 2020, at that time, had not needed her PRN BB, doing well.  NO tachycardia episodes, noted via her fit bit appropriate physiologic HR responses to exercise. Planned for annual visit  TODAY She continues to do quite well. She continues to exercise regularly, enjoys spinning and yoga with excellent exertional capacity, monitors her HR and will idle back to keep her HR 160 or less. No CP, SOB, DOE, no dizzy spells, near syncope or syncope.  Saw her PMD recently with c/o dizziness, occurs with position changes, like coming up from being bent over, is a sensation of moving, not lightheaded, no like she is going to faint, he dx it as vertigo.  About 51mo ago she had an episoe that her L arm felt numb/tingly, not weak, but numb, and self resolve, has not happened again    Past Medical History:  Diagnosis Date  . Baker's cyst of knee 04/12/12  . PSVT (paroxysmal supraventricular tachycardia) ([place])   . SOB (shortness of breath) on exertion 06/26/13   Has become more active & when she takes spinning classes, heart rate goes up to 200 and sometimes it is associated with chest tightness. Stress echo 08/13/13 = no signs of ischemia, however at the peak exercise a SVT with HR 215/minute was recorded.   . SVD (spontaneous vaginal delivery)    x 1  . Tachycardia 06/26/13    Past Surgical History:  Procedure Laterality Date  . CESAREAN SECTION    . COLONOSCOPY    . dislocated arm    . ROBOTIC ASSISTED  BILATERAL SALPINGO OOPHERECTOMY Bilateral 03/12/2016   Procedure: ROBOTIC ASSISTED BILATERAL SALPINGECTOMY  AND LEFT OOPHERECTOMY;  Surgeon: Bobbye Charleston, MD;  Location: [facility];  Service: Gynecology;  Laterality: Bilateral;  . WISDOM TOOTH EXTRACTION      Current Outpatient Medications  Medication Sig Dispense Refill  . ALPRAZolam (XANAX) 0.25 MG tablet Take 0.25 mg by mouth at bedtime as needed for sleep (prn when flying).     . Probiotic Product (ALIGN) 4 MG CAPS daily in the afternoon.    . tretinoin (RETIN-A) 0.05 % cream tretinoin 0.05 % topical cream  APPLY PEA SIZED AMOUNT TO FACE AT BEDTIME FOR COSMETIC PURPOSES    . zolpidem (AMBIEN) 5 MG tablet Take 5 mg by mouth as directed. Take as needed for overseas travel     No current facility-administered medications for this visit.    Allergies:   Patient has no known allergies.   Social History:  The patient  reports that she has never smoked. She has never used smokeless tobacco. She reports current alcohol use of about 14.0 standard drinks of alcohol per week. She reports that she does not use drugs.   Family History:  The patient's family history includes Atrial fibrillation in her mother; Diabetes in her maternal grandfather; Hypertension in her mother; Prostate cancer in her father.  ROS:  Please see the history of present illness.    All other systems  are reviewed and otherwise negative.   PHYSICAL EXAM:  VS:  LMP 11/26/2015 (Approximate)  BMI: There is no height or weight on file to calculate BMI. Well nourished, well developed, in no acute distress HEENT: normocephalic, atraumatic Neck: no JVD, carotid bruits or masses Cardiac:  RRR; no significant murmurs, no rubs, or gallops Lungs:  CTA b/l, no wheezing, rhonchi or rales Abd: soft, nontender MS: no deformity or atrophy Ext: no edema Skin: warm and dry, no rash Neuro:  No gross deficits appreciated Psych: euthymic mood, full affect   EKG:  Done today and  reviewed by myself shows  SR 69bpm, PAC   08/02/2013: TTE Study Conclusions  - Left ventricle: The cavity size was normal. Wall thickness  was normal. Systolic function was normal. The estimated  ejection fraction was in the range of 55% to 65%. Wall  motion was normal; there were no regional wall motion  abnormalities. Features are consistent with a pseudonormal  left ventricular filling pattern, with concomitant  abnormal relaxation and increased filling pressure (grade  2 diastolic dysfunction).  - Atrial septum: No defect or patent foramen ovale was  identified.     Recent Labs: No results found for requested labs within last 8760 hours.  No results found for requested labs within last 8760 hours.   CrCl cannot be calculated (Patient's most recent lab result is older than the maximum 21 days allowed.).   Wt Readings from Last 3 Encounters:  11/20/18 152 lb (68.9 kg)  12/29/16 155 lb 9.6 oz (70.6 kg)  09/04/16 156 lb 4.8 oz (70.9 kg)     Other studies reviewed: Additional studies/records reviewed today include: summarized above  ASSESSMENT AND PLAN:  1. Tachycardia      Chart mentions an SVT      Not recurrent  2. She requests a coronary calcium score for cardiac screening     Has her lipids done 06/05/20     HDL 72     LDL 112     Trigs 76      PMD advised dietary changes to start       Suspect L arm transient numbness may have been musculoskeletal/neck?  Has not happened again, though recommended if so to get evaluated.  Disposition: will get Ca++ score done, otherwise see her back in a year again, sooner if needed    Current medicines are reviewed at length with the patient today.  The patient did not have any concerns regarding medicines.  Venetia Night, PA-C 08/28/2020 3:44 PM     [address] [place] [address] [phone] (office)  [phone] (fax)

## 2020-08-28 ENCOUNTER — Other Ambulatory Visit: Payer: Self-pay

## 2020-08-28 ENCOUNTER — Ambulatory Visit (INDEPENDENT_AMBULATORY_CARE_PROVIDER_SITE_OTHER): Payer: BLUE CROSS/BLUE SHIELD | Admitting: Physician Assistant

## 2020-08-28 DIAGNOSIS — R000 Tachycardia, unspecified: Secondary | ICD-10-CM | POA: Diagnosis not present

## 2020-08-28 DIAGNOSIS — Z136 Encounter for screening for cardiovascular disorders: Secondary | ICD-10-CM

## 2020-08-28 DIAGNOSIS — Z9189 Other specified personal risk factors, not elsewhere classified: Secondary | ICD-10-CM | POA: Diagnosis not present

## 2020-08-28 NOTE — Patient Instructions (Signed)
Medication Instructions:   Your physician recommends that you continue on your current medications as directed. Please refer to the Current Medication list given to you today.  *If you need a refill on your cardiac medications before your next appointment, please call your pharmacy*   Lab Work: [place]   If you have labs (blood work) drawn today and your tests are completely normal, you will receive your results only by:  [place] (if you have MyChart) OR  A paper copy in the mail If you have any lab test that is abnormal or we need to change your treatment, we will call you to review the results.   Testing/Procedures:  Non-Cardiac CT scanning, (CAT scanning), is a noninvasive, special x-ray that produces cross-sectional images of the body using x-rays and a computer. CT scans help physicians diagnose and treat medical conditions. For some CT exams, a contrast material is used to enhance visibility in the area of the body being studied. CT scans provide greater clarity and reveal more details than regular x-ray exams.   Follow-Up: At [facility], you and your health needs are our priority.  As part of our continuing mission to provide you with exceptional heart care, we have created designated Provider Care Teams.  These Care Teams include your primary Cardiologist (physician) and Advanced Practice Providers (APPs -  Physician Assistants and Nurse Practitioners) who all work together to provide you with the care you need, when you need it.  We recommend signing up for the patient portal called "MyChart".  Sign up information is provided on this After Visit Summary.  MyChart is used to connect with patients for Virtual Visits (Telemedicine).  Patients are able to view lab/test results, encounter notes, upcoming appointments, etc.  Non-urgent messages can be sent to your provider as well.   To learn more about what you can do with MyChart, go to [email or web address].     Your next appointment:   1 year(s)  The format for your next appointment:   In Person  Provider:    You may see Tommye Standard, PA-C     Other Instructions

## 2020-09-08 ENCOUNTER — Other Ambulatory Visit: Payer: BLUE CROSS/BLUE SHIELD

## 2020-09-09 ENCOUNTER — Inpatient Hospital Stay: Admission: RE | Admit: 2020-09-09 | Payer: BLUE CROSS/BLUE SHIELD | Source: Ambulatory Visit

## 2020-09-16 ENCOUNTER — Inpatient Hospital Stay: Admission: RE | Admit: 2020-09-16 | Payer: BLUE CROSS/BLUE SHIELD | Source: Ambulatory Visit

## 2020-09-23 ENCOUNTER — Other Ambulatory Visit: Payer: Self-pay

## 2020-09-23 ENCOUNTER — Ambulatory Visit (INDEPENDENT_AMBULATORY_CARE_PROVIDER_SITE_OTHER)
Admission: RE | Admit: 2020-09-23 | Discharge: 2020-09-23 | Disposition: A | Discharge status: Home / Self Care | Payer: Self-pay | Source: Ambulatory Visit | Attending: Physician Assistant | Admitting: Physician Assistant

## 2020-09-23 DIAGNOSIS — Z9189 Other specified personal risk factors, not elsewhere classified: Secondary | ICD-10-CM

## 2020-09-23 DIAGNOSIS — Z136 Encounter for screening for cardiovascular disorders: Secondary | ICD-10-CM

## 2020-12-03 ENCOUNTER — Telehealth: Payer: Self-pay | Admitting: Podiatry

## 2020-12-03 NOTE — Telephone Encounter (Signed)
OK, thanks! I don't know what happened.

## 2020-12-03 NOTE — Telephone Encounter (Signed)
Pt left message stating she was in to see the doctor in November of last yr and was measured for orthotics but never got them.  She stated it was late in the day and almost everyone had left.  Upon checking she was billed for the orthotics but I do not see where they were ordered. I asked pt if she would be ok for her to come back in to be remeasured for the orthotics and she agreed and is scheduled for 3.28.2022.

## 2020-12-08 ENCOUNTER — Other Ambulatory Visit: Payer: Self-pay

## 2020-12-08 ENCOUNTER — Ambulatory Visit (INDEPENDENT_AMBULATORY_CARE_PROVIDER_SITE_OTHER): Payer: BLUE CROSS/BLUE SHIELD | Admitting: Podiatry

## 2020-12-08 DIAGNOSIS — N189 Chronic kidney disease, unspecified: Secondary | ICD-10-CM | POA: Insufficient documentation

## 2020-12-08 DIAGNOSIS — M7741 Metatarsalgia, right foot: Secondary | ICD-10-CM

## 2020-12-08 DIAGNOSIS — Z833 Family history of diabetes mellitus: Secondary | ICD-10-CM | POA: Insufficient documentation

## 2020-12-08 DIAGNOSIS — M7742 Metatarsalgia, left foot: Secondary | ICD-10-CM

## 2020-12-08 DIAGNOSIS — G629 Polyneuropathy, unspecified: Secondary | ICD-10-CM | POA: Insufficient documentation

## 2020-12-08 DIAGNOSIS — F40243 Fear of flying: Secondary | ICD-10-CM | POA: Insufficient documentation

## 2020-12-08 DIAGNOSIS — M461 Sacroiliitis, not elsewhere classified: Secondary | ICD-10-CM | POA: Insufficient documentation

## 2020-12-08 DIAGNOSIS — M2042 Other hammer toe(s) (acquired), left foot: Secondary | ICD-10-CM

## 2020-12-08 DIAGNOSIS — M2041 Other hammer toe(s) (acquired), right foot: Secondary | ICD-10-CM

## 2020-12-08 DIAGNOSIS — G479 Sleep disorder, unspecified: Secondary | ICD-10-CM | POA: Insufficient documentation

## 2020-12-08 DIAGNOSIS — E7800 Pure hypercholesterolemia, unspecified: Secondary | ICD-10-CM | POA: Insufficient documentation

## 2020-12-08 DIAGNOSIS — F419 Anxiety disorder, unspecified: Secondary | ICD-10-CM | POA: Insufficient documentation

## 2020-12-08 NOTE — Progress Notes (Signed)
Patient presents to get re-molded for orthotics.  A foam impression was casted for her right and left foot.  Patient is a size 9 and patient stated that she was billed for the orthotics back in November of 2021 and never received the orthotics.

## 2021-01-12 ENCOUNTER — Other Ambulatory Visit: Payer: BLUE CROSS/BLUE SHIELD

## 2021-01-13 ENCOUNTER — Ambulatory Visit (INDEPENDENT_AMBULATORY_CARE_PROVIDER_SITE_OTHER): Payer: BLUE CROSS/BLUE SHIELD | Admitting: Podiatry

## 2021-01-13 ENCOUNTER — Other Ambulatory Visit: Payer: Self-pay

## 2021-01-13 DIAGNOSIS — M2041 Other hammer toe(s) (acquired), right foot: Secondary | ICD-10-CM

## 2021-01-13 DIAGNOSIS — M2042 Other hammer toe(s) (acquired), left foot: Secondary | ICD-10-CM

## 2021-01-13 DIAGNOSIS — M7741 Metatarsalgia, right foot: Secondary | ICD-10-CM

## 2021-01-13 DIAGNOSIS — M7742 Metatarsalgia, left foot: Secondary | ICD-10-CM

## 2021-01-13 NOTE — Progress Notes (Signed)
Patient presents today for orthotic pick up. Patient voices no new complaints.  Orthotics were fitted to patient's feet. No discomfort and no rubbing. Patient satisfied with the orthotics.  Orthotics were dispensed to patient with instructions for break in wear and to call the office with any concerns or questions. 

## 2021-01-13 NOTE — Patient Instructions (Signed)

## 2021-11-29 NOTE — Progress Notes (Deleted)
? ?Cardiology Office Note ?Date:  11/29/2021  ?Patient ID:  Amber Patel, DOB Jan 30, 1961, MRN [id number] ?PCP:  Lennie Odor, PA  ?Cardiologist:  Dr. Meda Coffee (2014) ?Electrophysiologist: Dr. Rayann Heman ? ?  ?Chief Complaint:  *** annual visit ? ?History of Present Illness: ?Amber Patel is a 61 y.o. female with history of tachycardia (chart notes: 08/13/13 = no signs of ischemia, however at the peak exercise a SVT with HR 215/minute was recorded) ? ?She comes in today to be seen for Dr. Rayann Heman, last seen by him march 2020, at that time, had not needed her PRN BB, doing well.  NO tachycardia episodes, noted via her fit bit appropriate physiologic HR responses to exercise. ?Planned for annual visit ? ?I saw her 08/28/20 ?She continues to do quite well. ?She continues to exercise regularly, enjoys spinning and yoga with excellent exertional capacity, monitors her HR and will idle back to keep her HR 160 or less. ?No CP, SOB, DOE, no dizzy spells, near syncope or syncope. ?Saw her PMD recently with c/o dizziness, occurs with position changes, like coming up from being bent over, is a sensation of moving, not lightheaded, no like she is going to faint, he dx it as vertigo. ?About 18moago she had an episode that her L arm felt numb/tingly, not weak, but numb, and self resolve, has not happened again ?Suspect to be musculoskeletal ?Planned for coronary Ca scoring ? ?Ca++ zero ? ?*** symptoms ?*** labs, lipids ?*** SVT? ?*** prn?, new EP? ? ? ? ?Past Medical History:  ?Diagnosis Date  ? Baker's cyst of knee 04/12/12  ? [place]   ? SOB (shortness of breath) on exertion 06/26/13  ? Has become more active & when she takes spinning classes, heart rate goes up to 200 and sometimes it is associated with chest tightness. Stress echo 08/13/13 = no signs of ischemia, however at the peak exercise a SVT with HR 215/minute was recorded.   ? SVD (spontaneous vaginal delivery)   ? x 1  ? Tachycardia  06/26/13  ? ? ?Past Surgical History:  ?Procedure Laterality Date  ? CESAREAN SECTION    ? COLONOSCOPY    ? dislocated arm    ? ROBOTIC ASSISTED BILATERAL SALPINGO OOPHERECTOMY Bilateral 03/12/2016  ? Procedure: ROBOTIC ASSISTED BILATERAL SALPINGECTOMY  AND LEFT OOPHERECTOMY;  Surgeon: MBobbye Charleston MD;  [facility];  Service: Gynecology;  Laterality: Bilateral;  ? WISDOM TOOTH EXTRACTION    ? ? ?Current Outpatient Medications  ?Medication Sig Dispense Refill  ? ALPRAZolam (XANAX) 0.25 MG tablet Take 0.25 mg by mouth at bedtime as needed for sleep (prn when flying).     ? Probiotic Product (ALIGN) 4 MG CAPS daily in the afternoon.    ? tretinoin (RETIN-A) 0.05 % cream tretinoin 0.05 % topical cream ? APPLY PEA SIZED AMOUNT TO FACE AT BEDTIME FOR COSMETIC PURPOSES    ? zolpidem (AMBIEN) 5 MG tablet Take 5 mg by mouth as directed. Take as needed for overseas travel    ? ?No current facility-administered medications for this visit.  ? ? ?Allergies:   Cyclobenzaprine  ? ?Social History:  The patient  reports that she has never smoked. She has never used smokeless tobacco. She reports current alcohol use of about 14.0 standard drinks per week. She reports that she does not use drugs.  ? ?Family History:  The patient's family history includes Atrial fibrillation in her mother; Diabetes in her maternal grandfather; Hypertension in her mother; Prostate  cancer in her father. ? ?ROS:  Please see the history of present illness.    ?All other systems are reviewed and otherwise negative.  ? ?PHYSICAL EXAM:  ?VS:  LMP 11/26/2015 (Approximate)  BMI: There is no height or weight on file to calculate BMI. ?Well nourished, well developed, in no acute distress ?HEENT: normocephalic, atraumatic ?Neck: no JVD, carotid bruits or masses ?Cardiac:  *** RRR; no significant murmurs, no rubs, or gallops ?Lungs:  *** CTA b/l, no wheezing, rhonchi or rales ?Abd: soft, nontender ?MS: no deformity or atrophy ?Ext: *** no edema ?Skin: warm  and dry, no rash ?Neuro:  No gross deficits appreciated ?Psych: euthymic mood, full affect ? ? ?EKG:  Done today and reviewed by myself shows  ?*** ? ? ?08/02/2013: TTE ?Study Conclusions  ?- Left ventricle: The cavity size was normal. Wall thickness  ?  was normal. Systolic function was normal. The estimated  ?  ejection fraction was in the range of 55% to 65%. Wall  ?  motion was normal; there were no regional wall motion  ?  abnormalities. Features are consistent with a pseudonormal  ?  left ventricular filling pattern, with concomitant  ?  abnormal relaxation and increased filling pressure (grade  ?  2 diastolic dysfunction).  ?- Atrial septum: No defect or patent foramen ovale was  ?  identified.  ? ? ? ?Recent Labs: ?No results found for requested labs within last 8760 hours.  ?No results found for requested labs within last 8760 hours.  ? ?CrCl cannot be calculated (Patient's most recent lab result is older than the maximum 21 days allowed.).  ? ?Wt Readings from Last 3 Encounters:  ?11/20/18 152 lb (68.9 kg)  ?12/29/16 155 lb 9.6 oz (70.6 kg)  ?09/04/16 156 lb 4.8 oz (70.9 kg)  ?  ? ?Other studies reviewed: ?Additional studies/records reviewed today include: summarized above ? ?ASSESSMENT AND PLAN: ? ?1. Tachycardia ?     Chart mentions an SVT ?     *** Not recurrent ? ? ?Disposition: ***  ? ? ? ?Current medicines are reviewed at length with the patient today.  The patient did not have any concerns regarding medicines. ? ?Signed, ?Tommye Standard, PA-C ?11/29/2021 10:23 AM    ? ?[facility] ?[address] ?[address] ?[place] [address] ?[phone] (office)  ?[phone] (fax) ? ? ?

## 2021-12-01 ENCOUNTER — Ambulatory Visit: Payer: BLUE CROSS/BLUE SHIELD | Admitting: Physician Assistant

## 2021-12-27 NOTE — Progress Notes (Signed)
? ?Cardiology Office Note ?Date:  12/27/2021  ?Patient ID:  Amber Patel, DOB [birth year], MRN [id number] ?PCP:  Lennie Odor, PA  ?Cardiologist:  Dr. Meda Coffee (2014) ?Electrophysiologist: Dr. Rayann Heman ? ?  ?Chief Complaint:   annual visit ? ?History of Present Illness: ?Amber Patel is a 61 y.o. female with history of tachycardia (chart notes: 08/13/13 = no signs of ischemia, however at the peak exercise a SVT with HR 215/minute was recorded) ? ?She comes in today to be seen for Dr. Rayann Heman, last seen by him march 2020, at that time, had not needed her PRN BB, doing well.  NO tachycardia episodes, noted via her fit bit appropriate physiologic HR responses to exercise. ?Planned for annual visit ? ?I saw her 08/28/20 ?She continues to do quite well. ?She continues to exercise regularly, enjoys spinning and yoga with excellent exertional capacity, monitors her HR and will idle back to keep her HR 160 or less. ?No CP, SOB, DOE, no dizzy spells, near syncope or syncope. ?Saw her PMD recently with c/o dizziness, occurs with position changes, like coming up from being bent over, is a sensation of moving, not lightheaded, no like she is going to faint, he dx it as vertigo. ?About 36moago she had an episode that her L arm felt numb/tingly, not weak, but numb, and self resolve, has not happened again ?Suspect to be musculoskeletal ?Planned for coronary Ca scoring ? ?Ca++ zero ? ?TODAY ?She continues to do very well ?Exercising regularly, spinning, some weights and yoga ?Great exertional capacity ?In review of her watch data great HR excursion ?No cardiac symptoms or concerns ? ?She never used the PRN BB, does not want it available to her at this time and a prescribed medication ? ? ? ?Past Medical History:  ?Diagnosis Date  ? Baker's cyst of knee 04/12/12  ? [place]   ? SOB (shortness of breath) on exertion 06/26/13  ? Has become more active & when she takes spinning classes, heart rate  goes up to 200 and sometimes it is associated with chest tightness. Stress echo 08/13/13 = no signs of ischemia, however at the peak exercise a SVT with HR 215/minute was recorded.   ? SVD (spontaneous vaginal delivery)   ? x 1  ? Tachycardia 06/26/13  ? ? ?Past Surgical History:  ?Procedure Laterality Date  ? CESAREAN SECTION    ? COLONOSCOPY    ? dislocated arm    ? ROBOTIC ASSISTED BILATERAL SALPINGO OOPHERECTOMY Bilateral 03/12/2016  ? Procedure: ROBOTIC ASSISTED BILATERAL SALPINGECTOMY  AND LEFT OOPHERECTOMY;  Surgeon: MBobbye Charleston MD;  [facility];  Service: Gynecology;  Laterality: Bilateral;  ? WISDOM TOOTH EXTRACTION    ? ? ?Current Outpatient Medications  ?Medication Sig Dispense Refill  ? ALPRAZolam (XANAX) 0.25 MG tablet Take 0.25 mg by mouth at bedtime as needed for sleep (prn when flying).     ? Probiotic Product (ALIGN) 4 MG CAPS daily in the afternoon.    ? tretinoin (RETIN-A) 0.05 % cream tretinoin 0.05 % topical cream ? APPLY PEA SIZED AMOUNT TO FACE AT BEDTIME FOR COSMETIC PURPOSES    ? zolpidem (AMBIEN) 5 MG tablet Take 5 mg by mouth as directed. Take as needed for overseas travel    ? ?No current facility-administered medications for this visit.  ? ? ?Allergies:   Cyclobenzaprine  ? ?Social History:  The patient  reports that she has never smoked. She has never used smokeless tobacco. She reports current alcohol  use of about 14.0 standard drinks per week. She reports that she does not use drugs.  ? ?Family History:  The patient's family history includes Atrial fibrillation in her mother; Diabetes in her maternal grandfather; Hypertension in her mother; Prostate cancer in her father. ? ?ROS:  Please see the history of present illness.    ?All other systems are reviewed and otherwise negative.  ? ?PHYSICAL EXAM:  ?VS:  LMP 11/26/2015 (Approximate)  BMI: There is no height or weight on file to calculate BMI. ?Well nourished, well developed, in no acute distress ?HEENT: normocephalic,  atraumatic ?Neck: no JVD, carotid bruits or masses ?Cardiac:  RRR; no significant murmurs, no rubs, or gallops ?Lungs:  CTA b/l, no wheezing, rhonchi or rales ?Abd: soft, nontender ?MS: no deformity or atrophy ?Ext: no edema ?Skin: warm and dry, no rash ?Neuro:  No gross deficits appreciated ?Psych: euthymic mood, full affect ? ? ?EKG:  Done today and reviewed by myself shows  ?SR 69bpm, no changes ? ? ?08/02/2013: TTE ?Study Conclusions  ?- Left ventricle: The cavity size was normal. Wall thickness  ?  was normal. Systolic function was normal. The estimated  ?  ejection fraction was in the range of 55% to 65%. Wall  ?  motion was normal; there were no regional wall motion  ?  abnormalities. Features are consistent with a pseudonormal  ?  left ventricular filling pattern, with concomitant  ?  abnormal relaxation and increased filling pressure (grade  ?  2 diastolic dysfunction).  ?- Atrial septum: No defect or patent foramen ovale was  ?  identified.  ? ? ? ?Recent Labs: ?No results found for requested labs within last 8760 hours.  ?No results found for requested labs within last 8760 hours.  ? ?CrCl cannot be calculated (Patient's most recent lab result is older than the maximum 21 days allowed.).  ? ?Wt Readings from Last 3 Encounters:  ?11/20/18 152 lb (68.9 kg)  ?12/29/16 155 lb 9.6 oz (70.6 kg)  ?09/04/16 156 lb 4.8 oz (70.9 kg)  ?  ? ?Other studies reviewed: ?Additional studies/records reviewed today include: summarized above ? ?ASSESSMENT AND PLAN: ? ?1. Tachycardia ?     Chart mentions an SVT ?     Not recurrent ? ?Discussed ongoing cardiac risk reduction with continued regular exercise, labs, lipid monitoring with her PMD ? ?Disposition: annually or PRN  ? ? ? ?Current medicines are reviewed at length with the patient today.  The patient did not have any concerns regarding medicines. ? ?Signed, ?Tommye Standard, PA-C ?12/27/2021 11:17 AM    ? ?[facility] ?[address] ?[address] ?[place]  [address] ?[phone] (office)  ?[phone] (fax) ? ? ?

## 2021-12-29 ENCOUNTER — Encounter: Payer: Self-pay | Admitting: Physician Assistant

## 2021-12-29 ENCOUNTER — Ambulatory Visit (INDEPENDENT_AMBULATORY_CARE_PROVIDER_SITE_OTHER): Payer: BLUE CROSS/BLUE SHIELD | Admitting: Physician Assistant

## 2021-12-29 VITALS — BP 126/64 | HR 69 | Ht 68.0 in | Wt 144.6 lb

## 2021-12-29 DIAGNOSIS — I471 Supraventricular tachycardia: Secondary | ICD-10-CM | POA: Diagnosis not present

## 2021-12-29 NOTE — Patient Instructions (Signed)
Medication Instructions:  ? ? ?Your physician recommends that you continue on your current medications as directed. Please refer to the Current Medication list given to you today. ? ?*If you need a refill on your cardiac medications before your next appointment, please call your pharmacy* ? ? ?Lab Work: [place] ? ? ?If you have labs (blood work) drawn today and your tests are completely normal, you will receive your results only by: ?MyChart Message (if you have MyChart) OR ?A paper copy in the mail ?If you have any lab test that is abnormal or we need to change your treatment, we will call you to review the results. ? ? ?Testing/Procedures: NONE ORDERED  TODAY ? ? ? ? ?Follow-Up: ?At [facility], you and your health needs are our priority.  As part of our continuing mission to provide you with exceptional heart care, we have created designated Provider Care Teams.  These Care Teams include your primary Cardiologist (physician) and Advanced Practice Providers (APPs -  Physician Assistants and Nurse Practitioners) who all work together to provide you with the care you need, when you need it. ? ?We recommend signing up for the patient portal called "MyChart".  Sign up information is provided on this After Visit Summary.  MyChart is used to connect with patients for Virtual Visits (Telemedicine).  Patients are able to view lab/test results, encounter notes, upcoming appointments, etc.  Non-urgent messages can be sent to your provider as well.   ?To learn more about what you can do with MyChart, go to [email or web address].   ? ?Your next appointment:   ?1 year(s) ? ?The format for your next appointment:   ?In Person ? ?Provider:   ?Tommye Standard, PA-C  ? ? ?Other Instructions ? ? ?Important Information About Sugar ? ? ? ? ?  ?

## 2022-03-25 ENCOUNTER — Other Ambulatory Visit: Payer: Self-pay | Admitting: Obstetrics and Gynecology

## 2022-03-25 DIAGNOSIS — E2839 Other primary ovarian failure: Secondary | ICD-10-CM

## 2022-05-04 ENCOUNTER — Ambulatory Visit
Admission: RE | Admit: 2022-05-04 | Discharge: 2022-05-04 | Disposition: A | Discharge status: Home / Self Care | Payer: BLUE CROSS/BLUE SHIELD | Source: Ambulatory Visit | Attending: Obstetrics and Gynecology | Admitting: Obstetrics and Gynecology

## 2022-05-04 DIAGNOSIS — E2839 Other primary ovarian failure: Secondary | ICD-10-CM

## 2022-06-10 IMAGING — CT CT CARDIAC CORONARY ARTERY CALCIUM SCORE
3 series · 14 of 20 positions shown, 15 images · non-contrast
Comparison: None.
COMPARISON: None.

Addendum:
EXAM:
OVER-READ INTERPRETATION  CT CHEST

The following report is an over-read performed by radiologist Dr.
Oumkeltoum Wassif [REDACTED] on 09/23/2020. This
over-read does not include interpretation of cardiac or coronary
anatomy or pathology. The coronary calcium score/coronary CTA
interpretation by the cardiologist is attached.
CLINICAL DATA: Risk stratification
Coronary Calcium Score
TECHNIQUE: The patient was scanned on a Siemens Somatom 64 slice scanner. Axial
non-contrast 3 mm slices were carried out through the heart. The
data set was analyzed on a dedicated work station and scored using
the Agatson method.

[Series 2: casc 3.0 bv41 2 bestdiast 72 % · axial · 0.36mm/px · z∈[+19,+100]mm · 4 of 47 slices shown, 5 images]
[im 10/47  vessel]
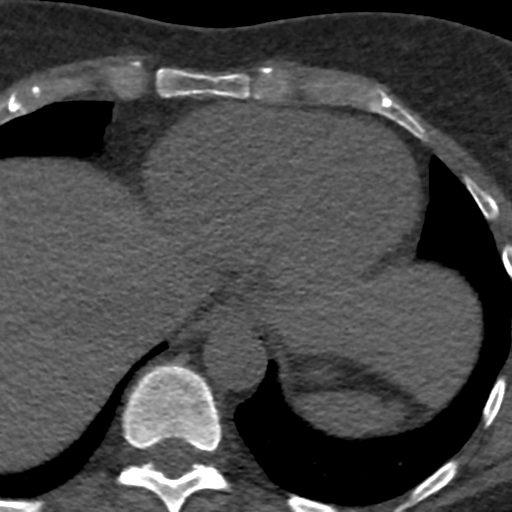
[im 10/47  lung]
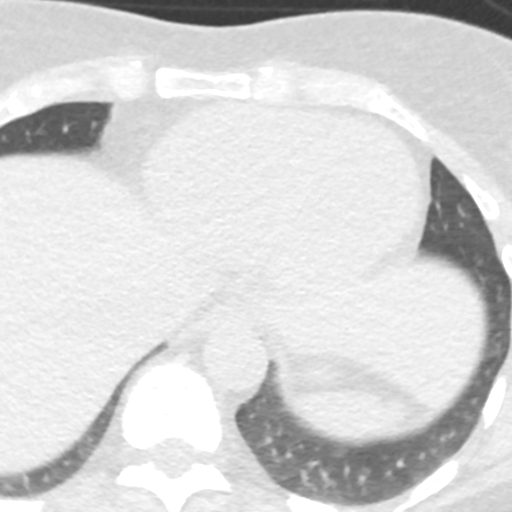
[im 19/47  vessel]
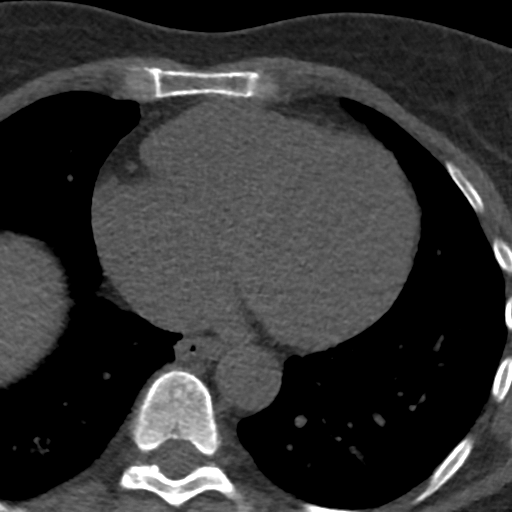
[im 28/47  vessel]
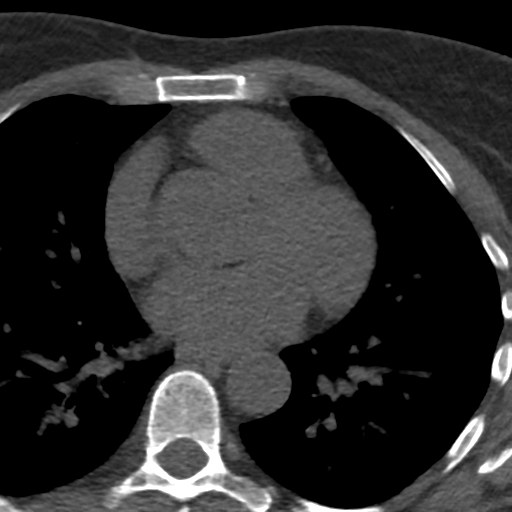
[im 37/47  vessel]
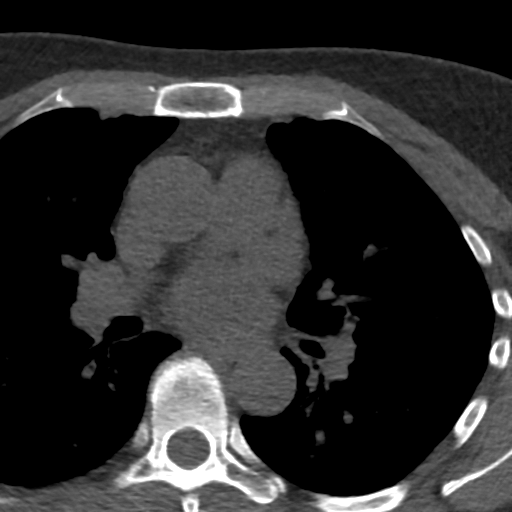

[Series 3: lung 72 % · axial · 0.68mm/px · z∈[+14,+106]mm · 5 of 47 slices shown]
[im 8/47  lung]
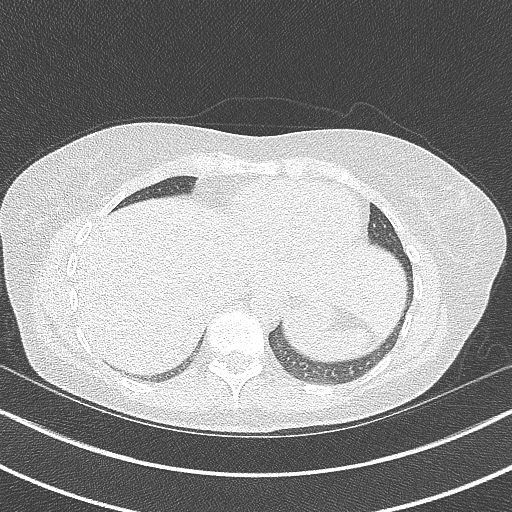
[im 16/47  lung]
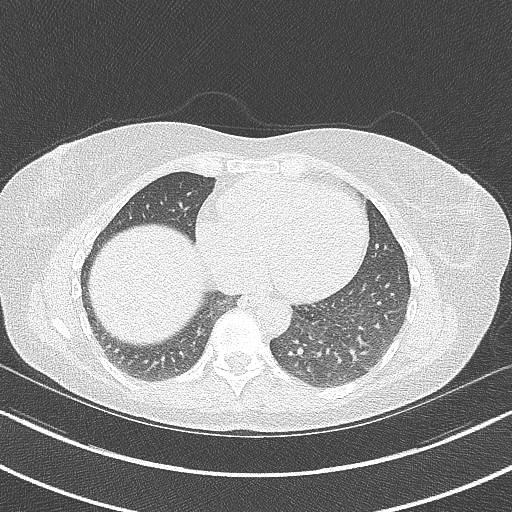
[im 24/47  lung]
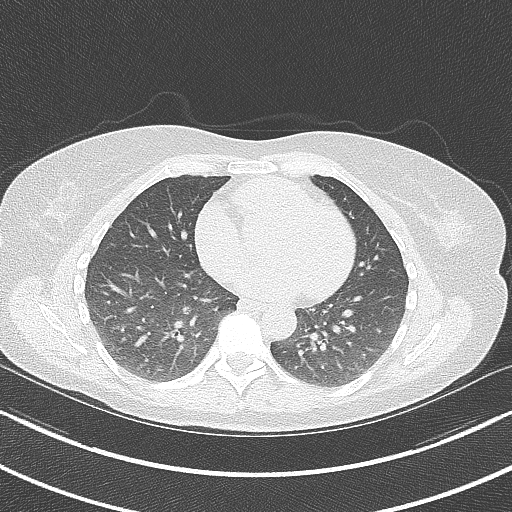
[im 31/47  lung]
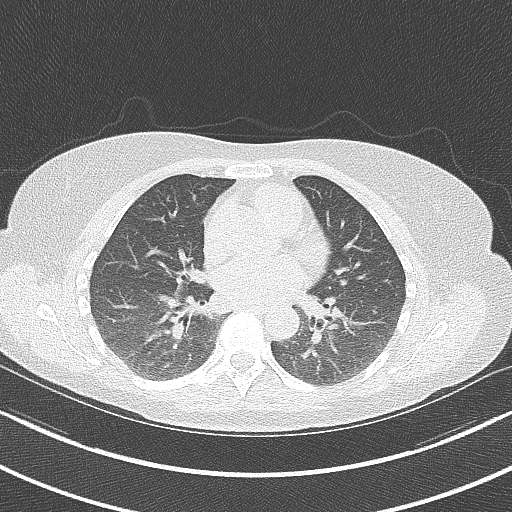
[im 39/47  lung]
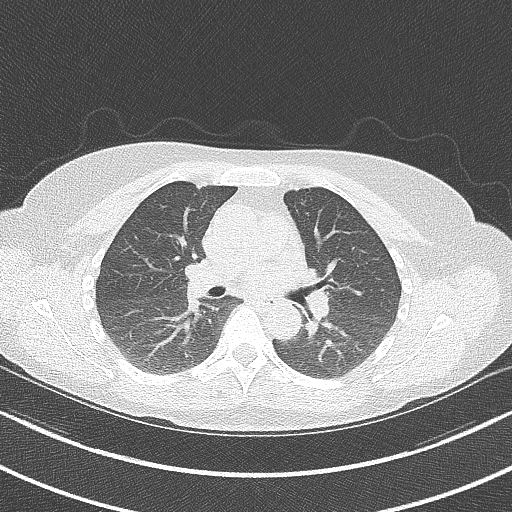

[Series 4: lung st 72 % · axial · 0.68mm/px · z∈[+14,+106]mm · 5 of 47 slices shown]
[im 8/47  lung]
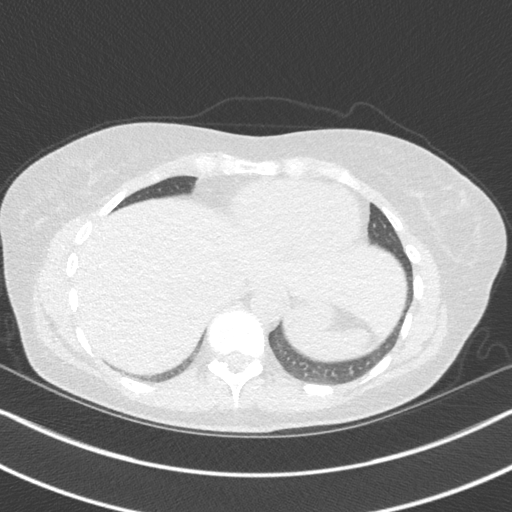
[im 16/47  lung]
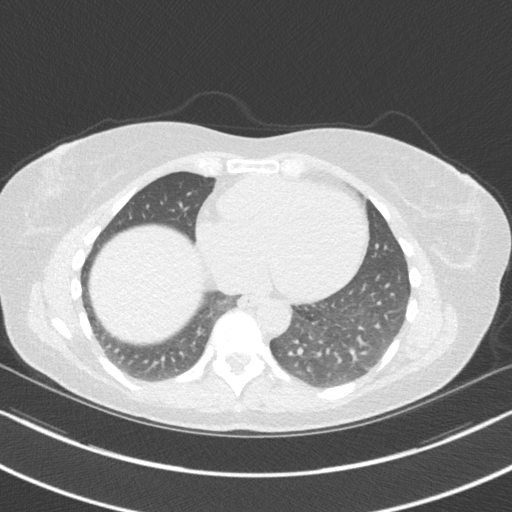
[im 24/47  lung]
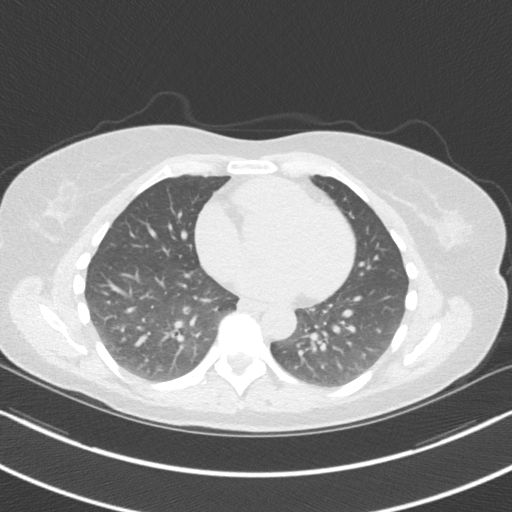
[im 31/47  lung]
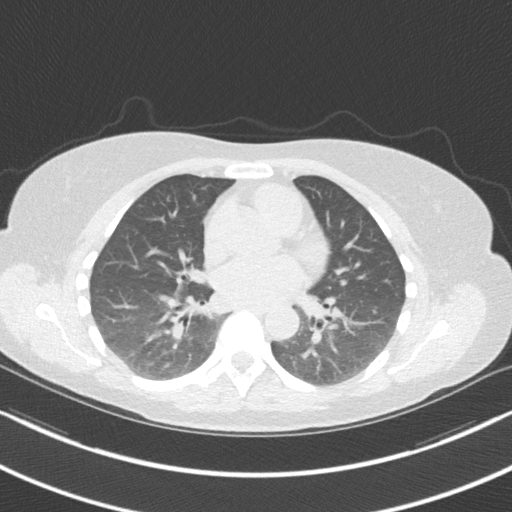
[im 39/47  lung]
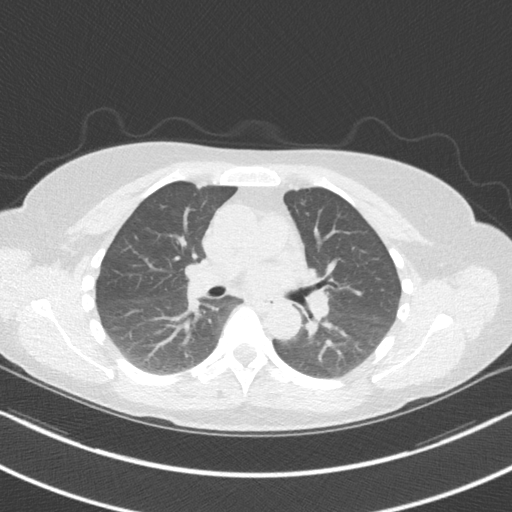

[14 of 20 positions shown; findings below may reference images not displayed]

FINDINGS: Within the visualized portions of the thorax there are no suspicious
appearing pulmonary nodules or masses, there is no acute
consolidative airspace disease, no pleural effusions, no
pneumothorax and no lymphadenopathy. Visualized portions of the
upper abdomen are unremarkable. There are no aggressive appearing
lytic or blastic lesions noted in the visualized portions of the
skeleton.
IMPRESSION: No significant incidental noncardiac findings are noted.
FINDINGS: Non-cardiac: See separate report from [REDACTED].

Ascending aorta: Normal size

Pericardium: Normal

Coronary arteries: Normal origin
IMPRESSION: Coronary calcium score of 0. This was 0 percentile for age and sex
matched control.

Saw Sandhu

*** End of Addendum ***
EXAM:
OVER-READ INTERPRETATION  CT CHEST

The following report is an over-read performed by radiologist Dr.
Oumkeltoum Wassif [REDACTED] on 09/23/2020. This
over-read does not include interpretation of cardiac or coronary
anatomy or pathology. The coronary calcium score/coronary CTA
interpretation by the cardiologist is attached.
FINDINGS: Within the visualized portions of the thorax there are no suspicious
appearing pulmonary nodules or masses, there is no acute
consolidative airspace disease, no pleural effusions, no
pneumothorax and no lymphadenopathy. Visualized portions of the
upper abdomen are unremarkable. There are no aggressive appearing
lytic or blastic lesions noted in the visualized portions of the
skeleton.
IMPRESSION: No significant incidental noncardiac findings are noted.

## 2022-10-27 ENCOUNTER — Other Ambulatory Visit: Payer: Self-pay | Admitting: Orthopedic Surgery

## 2022-10-27 DIAGNOSIS — M25552 Pain in left hip: Secondary | ICD-10-CM

## 2022-11-18 ENCOUNTER — Ambulatory Visit
Admission: RE | Admit: 2022-11-18 | Discharge: 2022-11-18 | Disposition: A | Discharge status: Home / Self Care | Payer: BLUE CROSS/BLUE SHIELD | Source: Ambulatory Visit | Attending: Orthopedic Surgery | Admitting: Orthopedic Surgery

## 2022-11-18 DIAGNOSIS — M25552 Pain in left hip: Secondary | ICD-10-CM

## 2022-11-30 ENCOUNTER — Other Ambulatory Visit: Payer: Self-pay | Admitting: Family Medicine

## 2022-11-30 DIAGNOSIS — M545 Low back pain, unspecified: Secondary | ICD-10-CM

## 2022-12-31 ENCOUNTER — Other Ambulatory Visit: Payer: BLUE CROSS/BLUE SHIELD

## 2023-01-28 ENCOUNTER — Other Ambulatory Visit: Payer: BLUE CROSS/BLUE SHIELD

## 2023-04-21 NOTE — Progress Notes (Signed)
  Electrophysiology Office Note:   Date:  04/22/2023  ID:  Amber Patel, DOB [birth year], MRN [id number]  Primary Cardiologist: None Electrophysiologist: None      History of Present Illness:   Amber Patel is a 62 y.o. female with h/o HLD, SVT, anxiety seen for routine electrophysiology followup.   Last seen in EP Clinic 12/2021 and was doing very well - exercising, had not used her PRN BB and this was discontinued.  Monitors HR via watch.   Since last being seen in our clinic the patient reports doing very well. She asks about her exercise and if it is ok to push at times above her recommended heart rate for exercise. She bikes, does yoga and walks the dog. Denies symptoms with exertion, has excellent exercise tolerance. She reviewed her HR monitoring via phone. Asks about HR's being low at night - occasionally into the high 40's-50's.  Reports she had one episode in the last year of what she thought was a fast rate but did not document it. She can not remember when it occurred or what she was doing.  Asks about her risk of AF development, risk of clot development and how to reduce stroke risk. States her cholesterol is monitored by her PCP.  She denies chest pain, palpitations, dyspnea, PND, orthopnea, nausea, vomiting, dizziness, syncope, edema, weight gain, or early satiety.   Review of systems complete and found to be negative unless listed in HPI.   EP Information / Studies Reviewed:    EKG is ordered today. Personal review as below.      NSR, rate 76, normal intervals  Studies:  ECHO 2014 > LVEF 55-65%, grade II DD Cardiac CT Scoring 09/2020 > coronary calcium score 0  Risk Assessment/Calculations:              Physical Exam:   VS:  BP 112/74   Pulse 76   Ht 5\' 8"  (1.727 m)   Wt 146 lb 3.2 oz (66.3 kg)   LMP 11/26/2015 (Approximate)   SpO2 99%   BMI 22.23 kg/m    Wt Readings from Last 3 Encounters:  04/22/23 146 lb 3.2 oz (66.3 kg)  12/29/21 144 lb 9.6 oz (65.6 kg)   11/20/18 152 lb (68.9 kg)     GEN: Well nourished, healthy appearing adult female in no acute distress NECK: No JVD; No carotid bruits CARDIAC: Regular rate and rhythm, no murmurs, rubs, gallops RESPIRATORY:  Clear to auscultation without rales, wheezing or rhonchi  ABDOMEN: Soft, non-tender, non-distended EXTREMITIES:  No edema; No deformity   ASSESSMENT AND PLAN:    SVT  Normal EF, CCS 0  -no known recurrent episodes of fast rates -rates reviewed on phone - in last year no episodes of AF noted -does not want PRN BB  -encouraged ongoing exercise, ok to continue activity level > does not have to stop at [facility] recommended for age given longstanding hx of exercise and asymptomatic above 160   Follow up with EP APP in 12 months & PRN   Signed, Canary Brim, MSN, APRN, NP-C, AGACNP-BC [facility] - Electrophysiology  04/22/2023, 9:16 AM

## 2023-04-22 ENCOUNTER — Encounter: Payer: Self-pay | Admitting: Student

## 2023-04-22 ENCOUNTER — Other Ambulatory Visit: Payer: Self-pay

## 2023-04-22 ENCOUNTER — Ambulatory Visit: Payer: BLUE CROSS/BLUE SHIELD | Admitting: Pulmonary Disease

## 2023-04-22 VITALS — BP 112/74 | HR 76 | Ht 68.0 in | Wt 146.2 lb

## 2023-04-22 DIAGNOSIS — I471 Supraventricular tachycardia, unspecified: Secondary | ICD-10-CM

## 2023-04-22 NOTE — Patient Instructions (Signed)
Medication Instructions:  Your physician recommends that you continue on your current medications as directed. Please refer to the Current Medication list given to you today.  *If you need a refill on your cardiac medications before your next appointment, please call your pharmacy*  Lab Work: None ordered If you have labs (blood work) drawn today and your tests are completely normal, you will receive your results only by: MyChart Message (if you have MyChart) OR A paper copy in the mail If you have any lab test that is abnormal or we need to change your treatment, we will call you to review the results.  Follow-Up: At [facility], you and your health needs are our priority.  As part of our continuing mission to provide you with exceptional heart care, we have created designated Provider Care Teams.  These Care Teams include your primary Cardiologist (physician) and Advanced Practice Providers (APPs -  Physician Assistants and Nurse Practitioners) who all work together to provide you with the care you need, when you need it.  Your next appointment:   1 year(s)  Provider:   You will see one of the following Advanced Practice Providers on your designated Care Team:   Francis Dowse, Charlott Holler [address]" [place], [place] Sherie Don, NP Canary Brim, NP

## 2023-11-28 ENCOUNTER — Telehealth: Payer: Self-pay | Admitting: Internal Medicine

## 2023-11-28 NOTE — Telephone Encounter (Signed)
 Received telephone call from patient    I have known her in the community She has given me clearance to look at / access Epic  PT says she has felt a little "weird" in chest   A pressure sensation   WOrse when laying flat in bed She may note some worsening with deep inspriation Discomfort does not appear to be associated with activity   She says about 1.5 weeks ago she felt bad one day    Some better the next   Didn't really develop   Never defined    Vague     Since then she says she has taken it a little easiar   She sent a couple apple watch tracings     Extensive motion   Appears regular   Probably SR  Note Ca score done    Score 0  REcomm: Symptoms for now do not sound cardiac      ? GI   Would empirically start some Pepcid   Watch diet  EKG strips are really nondiagnostic Recomm she get more strips and send   Follow    If symptoms change may adjust recommendations
